# Patient Record
Sex: Female | Born: 1985 | Race: White | Hispanic: No | Marital: Married | Smoking: Never smoker
Health system: Southern US, Community
[De-identification: ages and names within clinical notes are randomized; demographics above are authoritative.]

## PROBLEM LIST (undated history)

## (undated) DIAGNOSIS — F329 Major depressive disorder, single episode, unspecified: Secondary | ICD-10-CM

## (undated) DIAGNOSIS — R319 Hematuria, unspecified: Secondary | ICD-10-CM

## (undated) DIAGNOSIS — T7840XA Allergy, unspecified, initial encounter: Secondary | ICD-10-CM

## (undated) DIAGNOSIS — F32A Depression, unspecified: Secondary | ICD-10-CM

## (undated) DIAGNOSIS — I7300 Raynaud's syndrome without gangrene: Secondary | ICD-10-CM

## (undated) DIAGNOSIS — R32 Unspecified urinary incontinence: Secondary | ICD-10-CM

## (undated) HISTORY — DX: Unspecified urinary incontinence: R32

## (undated) HISTORY — PX: WISDOM TOOTH EXTRACTION: SHX21

## (undated) HISTORY — DX: Depression, unspecified: F32.A

## (undated) HISTORY — DX: Hematuria, unspecified: R31.9

## (undated) HISTORY — DX: Allergy, unspecified, initial encounter: T78.40XA

## (undated) HISTORY — PX: ADENOIDECTOMY: SUR15

## (undated) HISTORY — DX: Major depressive disorder, single episode, unspecified: F32.9

## (undated) HISTORY — PX: TONSILLECTOMY: SUR1361

## (undated) HISTORY — PX: TYMPANOSTOMY: SHX2586

## (undated) HISTORY — DX: Raynaud's syndrome without gangrene: I73.00

---

## 2008-08-26 DIAGNOSIS — Z86018 Personal history of other benign neoplasm: Secondary | ICD-10-CM

## 2008-08-26 HISTORY — DX: Personal history of other benign neoplasm: Z86.018

## 2008-09-26 ENCOUNTER — Ambulatory Visit: Payer: Self-pay | Admitting: Internal Medicine

## 2011-01-08 ENCOUNTER — Ambulatory Visit: Payer: Self-pay | Admitting: Family Medicine

## 2012-12-13 IMAGING — CR DG CHEST 2V
1 series · 2 of 2 positions shown · non-contrast
Comparison: none

REASON FOR EXAM: fever cough
COMMENTS:

PROCEDURE:     KDR - KDXR CHEST PA (OR AP) AND LAT  - January 08, 2011 [DATE]
RESULT:     Comparison: None

[Series 1: view not recorded · 0.17mm/px · 2 of 2 slices shown]
[im 1/2]
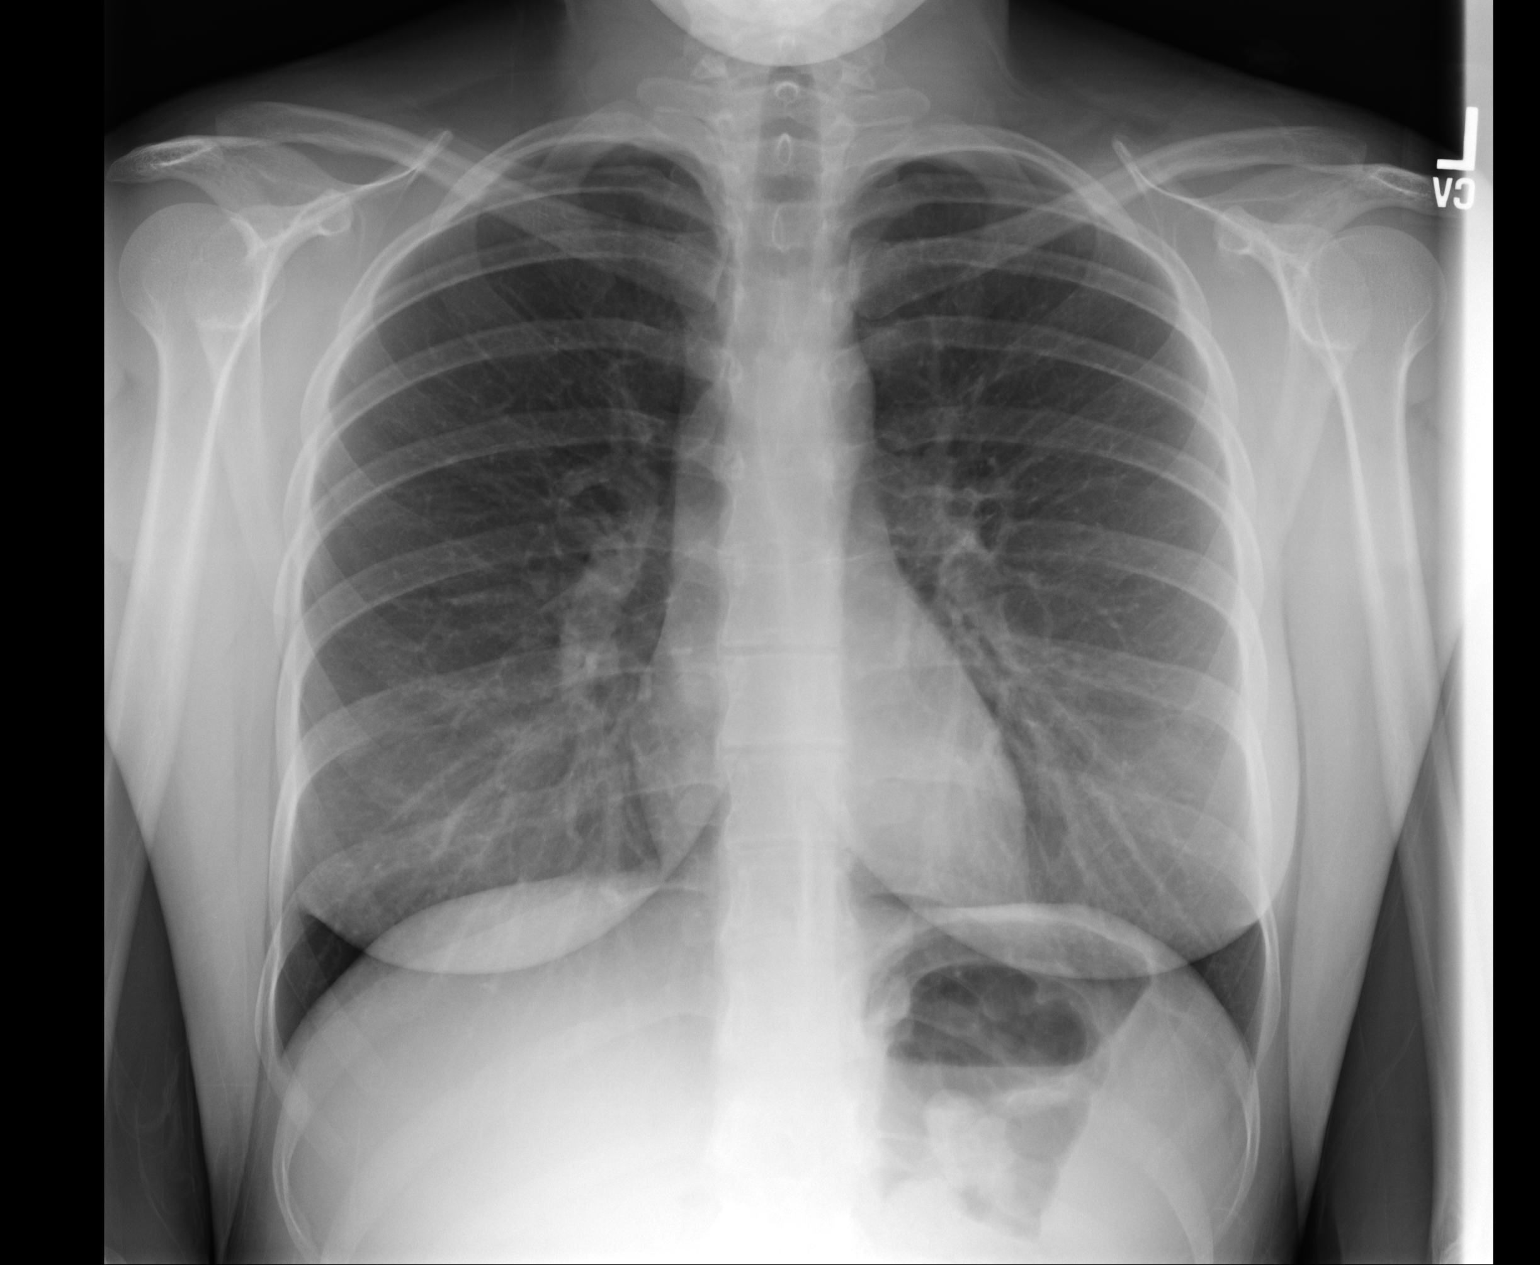
[im 2/2]
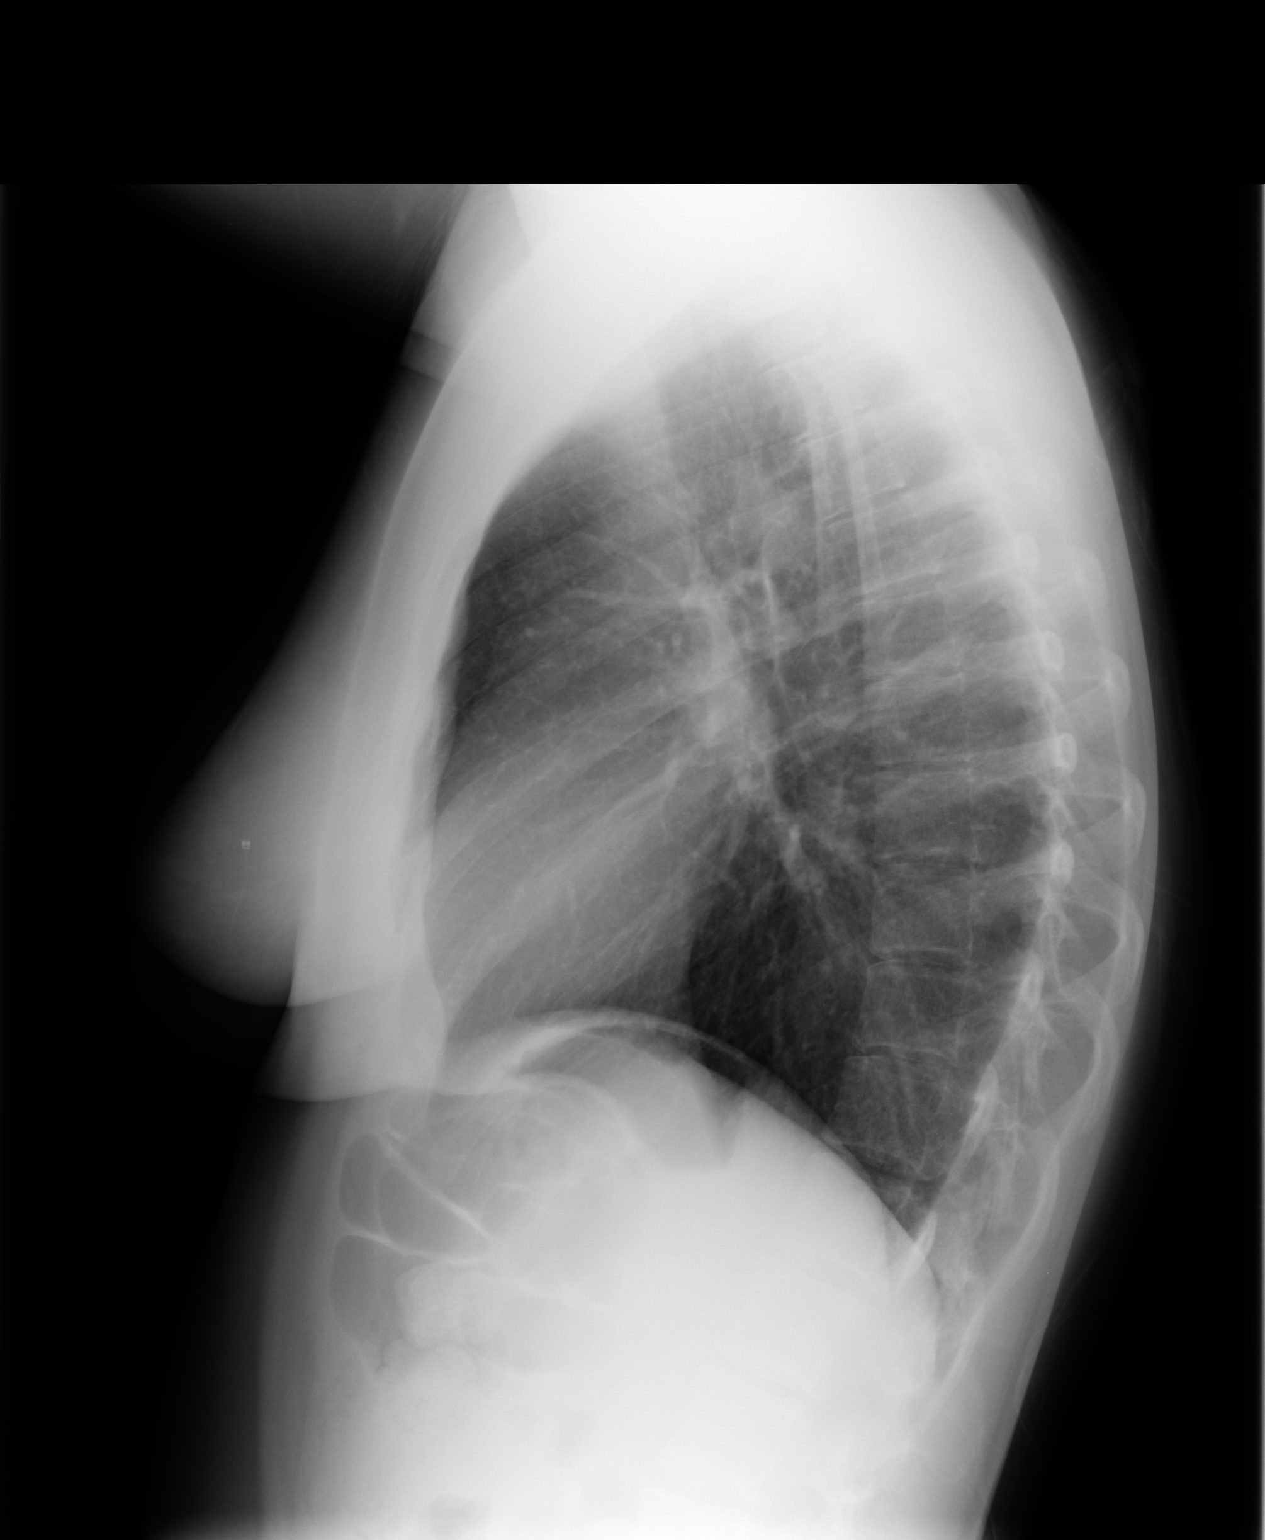

[2 of 2 positions shown; findings below may reference images not displayed]

FINDINGS: PA and lateral chest radiographs are provided.  There is no focal
parenchymal opacity, pleural effusion, or pneumothorax. The heart and
mediastinum are unremarkable.  The osseous structures are unremarkable.
IMPRESSION: No acute disease of the chest.

## 2015-03-31 DIAGNOSIS — K580 Irritable bowel syndrome with diarrhea: Secondary | ICD-10-CM | POA: Insufficient documentation

## 2015-03-31 DIAGNOSIS — M419 Scoliosis, unspecified: Secondary | ICD-10-CM | POA: Insufficient documentation

## 2015-03-31 LAB — HM HIV SCREENING LAB: HM HIV SCREENING: NEGATIVE

## 2015-12-05 DIAGNOSIS — F4329 Adjustment disorder with other symptoms: Secondary | ICD-10-CM | POA: Insufficient documentation

## 2015-12-17 DIAGNOSIS — N61 Mastitis without abscess: Secondary | ICD-10-CM | POA: Insufficient documentation

## 2015-12-22 DIAGNOSIS — N3946 Mixed incontinence: Secondary | ICD-10-CM | POA: Insufficient documentation

## 2016-12-05 LAB — HM PAP SMEAR: HM Pap smear: NORMAL

## 2017-09-20 ENCOUNTER — Ambulatory Visit (INDEPENDENT_AMBULATORY_CARE_PROVIDER_SITE_OTHER): Payer: BC Managed Care – PPO

## 2017-09-20 ENCOUNTER — Ambulatory Visit
Admission: EM | Admit: 2017-09-20 | Discharge: 2017-09-20 | Disposition: A | Discharge status: Home / Self Care | Payer: BC Managed Care – PPO | Attending: Family Medicine | Admitting: Family Medicine

## 2017-09-20 ENCOUNTER — Other Ambulatory Visit: Payer: Self-pay

## 2017-09-20 DIAGNOSIS — J181 Lobar pneumonia, unspecified organism: Secondary | ICD-10-CM

## 2017-09-20 DIAGNOSIS — R059 Cough, unspecified: Secondary | ICD-10-CM

## 2017-09-20 DIAGNOSIS — R05 Cough: Secondary | ICD-10-CM

## 2017-09-20 DIAGNOSIS — J189 Pneumonia, unspecified organism: Secondary | ICD-10-CM

## 2017-09-20 DIAGNOSIS — R509 Fever, unspecified: Secondary | ICD-10-CM

## 2017-09-20 MED ORDER — METHYLPREDNISOLONE SODIUM SUCC 40 MG IJ SOLR
80.0000 mg | Freq: Once | INTRAMUSCULAR | Status: AC
  Administered 2017-09-20: 80 mg via INTRAMUSCULAR

## 2017-09-20 MED ORDER — ALBUTEROL SULFATE HFA 108 (90 BASE) MCG/ACT IN AERS: 2.0000 | INHALATION_SPRAY | Freq: Four times a day (QID) | RESPIRATORY_TRACT | 0 refills | Status: DC | PRN

## 2017-09-20 MED ORDER — PREDNISONE 50 MG PO TABS: 50.0000 mg | ORAL_TABLET | Freq: Every day | ORAL | 0 refills | Status: DC

## 2017-09-20 MED ORDER — LEVOFLOXACIN 500 MG PO TABS: 500.0000 mg | ORAL_TABLET | Freq: Every day | ORAL | 0 refills | Status: AC

## 2017-09-20 MED ORDER — IBUPROFEN 600 MG PO TABS: 600.0000 mg | ORAL_TABLET | Freq: Three times a day (TID) | ORAL | 0 refills | Status: AC | PRN

## 2017-09-20 MED ORDER — IPRATROPIUM-ALBUTEROL 0.5-2.5 (3) MG/3ML IN SOLN
3.0000 mL | Freq: Once | RESPIRATORY_TRACT | Status: AC
  Administered 2017-09-20: 3 mL via RESPIRATORY_TRACT

## 2017-09-20 MED ORDER — ONDANSETRON 8 MG PO TBDP: 8.0000 mg | ORAL_TABLET | Freq: Three times a day (TID) | ORAL | 0 refills | Status: DC | PRN

## 2017-09-20 NOTE — ED Triage Notes (Signed)
Patient c/o cough, fever, fatigue. body aches, dizziness and vomiting x 8 days. Patient states she has not had an appetite but is able to keep fluids down.

## 2017-09-20 NOTE — Discharge Instructions (Addendum)
You were given a breathing treatment here today (Albuterol) as well as a shot of Prednisone (Solu-Medrol) to help with your breathing and symptoms. Recommend start Levaquin 500mg  once daily for 7 days. Start Prednisone 50mg  daily for 5 days then stop. May take Zofran 8mg  every 8 hours as needed for nausea. Increase fluid intake. May use Albuterol inhaler 2 puffs every 6 hours as needed for chest tightness/wheezing. Recommend take Ibuprofen 600mg  and alternate every 3 hours with Tylenol 1000mg  for fever. May take OTC cough medication to help with sleep as needed. [facility] in 3 days for recheck or go to ER if symptoms do not improve within 48 hours or ASAP if symptoms worsen.

## 2017-09-20 NOTE — ED Provider Notes (Signed)
[facility]    CSN: [id number] Arrival date & time: 09/20/17  1030     History   Chief Complaint Chief Complaint  Patient presents with  . Cough    HPI Barbara Perez is a 32 y.o. female.   32 year old female accompanied by her step mom presents with cough, fatigue, chills and fevers around 100-101 for the past 8 days. Also having decreased appetite and vomiting for the past 6 days. Able to keep liquids down and occasional soft foods. Having difficulty taking deep breaths and unable to sleep or lay down due to cough. 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 Husband had mild similar symptoms for about 3 days and then resolved. She has tried Garment/textile technologist Rub and Robitussin, with minimal relief. Co-worker called in Tessalon pills, as well as liquid cough medication with codeine as well as Zofran 4mg  for nausea with minimal relief. She has been also taking Tylenol and Ibuprofen with some relief in decreasing fevers. No history of asthma or other chronic health issues.    The history is provided by the patient and a caregiver.    History reviewed. No pertinent past medical history.  There are no active problems to display for this patient.   Past Surgical History:  Procedure Laterality Date  . ADENOIDECTOMY    . TONSILLECTOMY      OB History    No data available       Home Medications    Prior to Admission medications   Medication Sig Start Date End Date Taking? Authorizing Provider  acetaminophen (TYLENOL) 500 MG tablet Take 500 mg by mouth every 6 (six) hours as needed.   Yes [provider]  benzonatate (TESSALON) 100 MG capsule Take 100 mg by mouth 3 (three) times daily as needed for cough.   Yes [provider]  guaiFENesin-codeine (VIRTUSSIN A/C) 100-10 MG/5ML syrup Take 5 mLs by mouth 3 (three) times daily as needed for cough.   Yes [provider]  guaiFENesin-dextromethorphan (ROBITUSSIN DM) 100-10 MG/5ML syrup  Take 5 mLs by mouth every 4 (four) hours as needed for cough.   Yes [provider]  levonorgestrel (MIRENA) 20 MCG/24HR IUD 1 each by Intrauterine route once.   Yes [provider]  loratadine (CLARITIN) 10 MG tablet Take 10 mg by mouth daily.   Yes [provider]  albuterol (PROVENTIL HFA;VENTOLIN HFA) 108 (90 Base) MCG/ACT inhaler Inhale 2 puffs into the lungs every 6 (six) hours as needed for wheezing or shortness of breath. 09/20/17   Khamiyah Grefe, Ali Lowe, NP  ibuprofen (ADVIL,MOTRIN) 600 MG tablet Take 1 tablet (600 mg total) by mouth every 8 (eight) hours as needed for fever or moderate pain. 09/20/17   Sudie Grumbling, NP  levofloxacin (LEVAQUIN) 500 MG tablet Take 1 tablet (500 mg total) by mouth daily for 7 days. 09/20/17 09/27/17  Sudie Grumbling, NP  ondansetron (ZOFRAN ODT) 8 MG disintegrating tablet Take 1 tablet (8 mg total) by mouth every 8 (eight) hours as needed for nausea or vomiting. 09/20/17   Roseline Ebarb, Ali Lowe, NP  predniSONE (DELTASONE) 50 MG tablet Take 1 tablet (50 mg total) by mouth daily with supper. 09/20/17   Sudie Grumbling, NP    Family History Family History  Problem Relation Age of Onset  . Brain cancer Mother   . Cerebral aneurysm Mother     Social History Social History   Tobacco Use  .  Smoking status: Never Smoker  . Smokeless tobacco: Never Used  Substance Use Topics  . Alcohol use: No    Frequency: Never  . Drug use: No     Allergies   Ceclor [cefaclor] and Penicillins   Review of Systems Review of Systems  Constitutional: Positive for activity change, appetite change, chills, fatigue and fever.  HENT: Positive for congestion, postnasal drip, sinus pressure and sore throat. Negative for ear discharge, ear pain, facial swelling, mouth sores, rhinorrhea, sinus pain and trouble swallowing.   Eyes: Negative for pain, discharge, redness and itching.  Respiratory: Positive for cough, chest tightness, shortness of breath and  wheezing. Negative for stridor.   Cardiovascular: Negative for chest pain.  Gastrointestinal: Positive for nausea and vomiting. Negative for abdominal pain and diarrhea.  Genitourinary: Positive for enuresis (due to stress incontinence when coughing) and urgency. Negative for decreased urine volume and difficulty urinating.  Musculoskeletal: Positive for arthralgias and myalgias. Negative for neck pain and neck stiffness.  Skin: Negative for rash and wound.  Neurological: Positive for light-headedness and headaches. Negative for dizziness, tremors, seizures, syncope, weakness and numbness.  Hematological: Negative for adenopathy. Does not bruise/bleed easily.  Psychiatric/Behavioral: Negative.      Physical Exam Triage Vital Signs ED Triage Vitals  Enc Vitals Group     BP 09/20/17 1110 (!) 105/58     Pulse Rate 09/20/17 1110 98     Resp --      Temp 09/20/17 1110 98.1 F (36.7 C)     Temp Source 09/20/17 1110 Axillary     SpO2 09/20/17 1110 97 %     Weight 09/20/17 1112 144 lb (65.3 kg)     Height 09/20/17 1112 5\' 2"  (1.575 m)     Head Circumference --      Peak Flow --      Pain Score 09/20/17 1110 2     Pain Loc --      Pain Edu? --      Excl. in GC? --    No data found.  Updated Vital Signs BP (!) 105/58 (BP Location: Left Arm)   Pulse 98   Temp 98.1 F (36.7 C) (Axillary)   Ht 5\' 2"  (1.575 m)   Wt 144 lb (65.3 kg)   SpO2 97%   BMI 26.34 kg/m   Visual Acuity Right Eye Distance:   Left Eye Distance:   Bilateral Distance:    Right Eye Near:   Left Eye Near:    Bilateral Near:     Physical Exam  Constitutional: She is oriented to person, place, and time. She appears well-developed and well-nourished. She has a sickly appearance. She appears ill. No distress.  Patient sitting on exam table and appears ill. In no acute distress.   HENT:  Head: Normocephalic and atraumatic.  Right Ear: Hearing, tympanic membrane, external ear and ear canal normal.  Left Ear:  Hearing, tympanic membrane, external ear and ear canal normal.  Nose: Rhinorrhea present. No mucosal edema. Right sinus exhibits no maxillary sinus tenderness and no frontal sinus tenderness. Left sinus exhibits no maxillary sinus tenderness and no frontal sinus tenderness.  Mouth/Throat: Uvula is midline and mucous membranes are normal. Posterior oropharyngeal erythema present.  Eyes: Conjunctivae and EOM are normal.  Neck: Normal range of motion. Neck supple.  Cardiovascular: Regular rhythm and normal heart sounds. Tachycardia present.  Pulmonary/Chest: Effort normal. No stridor. Tachypnea noted. No respiratory distress. She has decreased breath sounds in the right upper field, the right  lower field, the left upper field and the left lower field. She has no wheezes. She has no rhonchi. She has no rales.  Abdominal: Soft. Normal appearance and bowel sounds are normal. She exhibits no mass. There is generalized tenderness. There is no rigidity, no rebound, no guarding and no CVA tenderness.  Lymphadenopathy:    She has no cervical adenopathy.  Neurological: She is alert and oriented to person, place, and time.  Skin: Skin is warm. Capillary refill takes less than 2 seconds. She is diaphoretic.  Psychiatric: She has a normal mood and affect. Her behavior is normal. Judgment and thought content normal.     UC Treatments / Results  Labs (all labs ordered are listed, but only abnormal results are displayed) Labs Reviewed - No data to display  EKG  EKG Interpretation None       Radiology Dg Chest 2 View  Result Date: 09/20/2017 CLINICAL DATA:  32 year old female with a history of cough and shortness of breath EXAM: CHEST  2 VIEW COMPARISON:  01/08/2011 FINDINGS: Cardiomediastinal silhouette unchanged in size and contour. Mixed nodular and reticular opacities at the bilateral lung bases, worst on the right. No pleural effusion. No pneumothorax. No acute fracture. IMPRESSION: Multifocal  pneumonia, worst on the right. Electronically Signed   By: Gilmer Mor D.O.   On: 09/20/2017 13:01    Procedures Procedures (including critical care time)  Medications Ordered in UC Medications  ipratropium-albuterol (DUONEB) 0.5-2.5 (3) MG/3ML nebulizer solution 3 mL (3 mLs Nebulization Given 09/20/17 1155)  methylPREDNISolone sodium succinate (SOLU-MEDROL) 40 mg/mL injection 80 mg (80 mg Intramuscular Given 09/20/17 1344)     Initial Impression / Assessment and Plan / UC Course  I have reviewed the triage vital signs and the nursing notes.  Pertinent labs & imaging results that were available during my care of the patient were reviewed by me and considered in my medical decision making (see chart for details).    Reviewed clinical findings- gave DuoNeb treatment which did help with chest tightness and decreased rate of breathing. Still decreased breath sounds but increased air movement.  Reviewed chest x-ray results with patient- reviewed that pneumonia could still be viral but will treat for possible bacterial pneumonia. Due to chest inflammation, gave SoluMedrol 80mg  IM now.  Recommend start Levaquin 500mg  once daily. Start Prednisone 50mg  daily for 5 days then stop. May take Zofran 8mg  every 8 hours as needed for nausea. Increase fluid intake. May use Albuterol inhaler 2 puffs every 6 hours as needed for chest tightness/wheezing. Recommend take Ibuprofen 600mg  and alternate every 3 hours with Tylenol 1000mg  for fever. May take OTC cough medication to help with cough at night as needed. Note written for work. Follow-up here at [facility] in 3 days for recheck or go to ER if symptoms do not improve within 48 hours or ASAP if symptoms worsen.    Final Clinical Impressions(s) / UC Diagnoses   Final diagnoses:  Community acquired pneumonia of right lower lobe of lung (HCC)  Community acquired pneumonia of left lower lobe of lung (HCC)  Fever, unspecified  Cough    ED  Discharge Orders        Ordered    levofloxacin (LEVAQUIN) 500 MG tablet  Daily     09/20/17 1343    albuterol (PROVENTIL HFA;VENTOLIN HFA) 108 (90 Base) MCG/ACT inhaler  Every 6 hours PRN     09/20/17 1343    ondansetron (ZOFRAN ODT) 8 MG disintegrating tablet  Every  8 hours PRN     09/20/17 1343    ibuprofen (ADVIL,MOTRIN) 600 MG tablet  Every 8 hours PRN     09/20/17 1343    predniSONE (DELTASONE) 50 MG tablet  Daily with supper     09/20/17 1344       Controlled Substance Prescriptions  Controlled Substance Registry consulted? No   Sudie Grumbling, NP 09/20/17 2159

## 2017-09-23 ENCOUNTER — Encounter: Payer: Self-pay | Admitting: Family Medicine

## 2017-09-23 ENCOUNTER — Ambulatory Visit: Payer: BC Managed Care – PPO | Admitting: Family Medicine

## 2017-09-23 VITALS — BP 100/64 | HR 70 | Temp 97.6°F | Resp 16 | Ht 62.0 in | Wt 144.0 lb

## 2017-09-23 DIAGNOSIS — J189 Pneumonia, unspecified organism: Secondary | ICD-10-CM | POA: Diagnosis not present

## 2017-09-23 DIAGNOSIS — I7300 Raynaud's syndrome without gangrene: Secondary | ICD-10-CM | POA: Insufficient documentation

## 2017-09-23 DIAGNOSIS — N393 Stress incontinence (female) (male): Secondary | ICD-10-CM

## 2017-09-23 DIAGNOSIS — R32 Unspecified urinary incontinence: Secondary | ICD-10-CM | POA: Insufficient documentation

## 2017-09-23 NOTE — Progress Notes (Signed)
Patient: Barbara Perez, Female    DOB: [birth year], 32 y.o.   MRN: [id number] Visit Date: 09/23/2017  Today's Provider: Shirlee Latch, MD   Chief Complaint  Patient presents with  . Establish Care   Subjective:    Establish care Barbara Perez is a 32 y.o. female who presents today for health maintenance and complete physical. She feels fairly well. She was recently treated for CAP, and is c/o stress incontinence due to cough.  She reports exercising once per week for 60 minutes Dances or swims. She reports she is sleeping fairly well.  Stress urinary incontinence:  Recently gave birth to a 9 lb in March of 2017.  Since that time, she has noticed leakage of urine with coughing and sneezing.  It does not seem to be improving with time.  She is talked to her midwives who delivered her baby about this at her postpartum follow-up are recommended Kegel exercises.  This does not seem to have helped.  She has never done formal physical therapy for this.  -----------------------------------------------------------------  Follow up Urgent Care visit  Patient was seen at [facility] for cough on 09/20/2017. She was treated for CAP bilateral lower lungs. Treatment for this included 7 d course of levaquin and prednisone 5 d She reports good compliance with treatment. She reports this condition is Improved. Pt reports she feels 40% improved.  Complication from the flu Day 8 of flu worsened shortness of breath found to have PNA  ------------------------------------------------------------------------------------    Review of Systems  Constitutional: Positive for fatigue.  HENT: Positive for sinus pressure.   Respiratory: Positive for cough and shortness of breath.   Gastrointestinal: Positive for constipation.  Endocrine: Positive for cold intolerance.  Genitourinary: Positive for urgency.  Allergic/Immunologic: Positive for environmental allergies.  Neurological: Positive for  headaches.  All other systems reviewed and are negative.   Social History      She  reports that  has never smoked. she has never used smokeless tobacco. She reports that she drinks alcohol. She reports that she does not use drugs.       Social History   Socioeconomic History  . Marital status: Married    Spouse name: Orthoptist  . Number of children: 1  . Years of education: 60  . Highest education level: Bachelor's degree (e.g., BA, AB, BS)  Social Needs  . Financial resource strain: Not very hard  . Food insecurity - worry: Never true  . Food insecurity - inability: Never true  . Transportation needs - medical: No  . Transportation needs - non-medical: No  Occupational History    Employer: [employer]  Tobacco Use  . Smoking status: Never Smoker  . Smokeless tobacco: Never Used  Substance and Sexual Activity  . Alcohol use: Yes    Frequency: Never    Comment: rare  . Drug use: No  . Sexual activity: Yes  Other Topics Concern  . Not on file  Social History Narrative  . Not on file    Past Medical History:  Diagnosis Date  . Allergy   . Depression    postpartum  . Hematuria   . Reynolds syndrome [facility])      There are no active problems to display for this patient.   Past Surgical History:  Procedure Laterality Date  . ADENOIDECTOMY    . TONSILLECTOMY    . TYMPANOSTOMY      Family History  Family Status  Relation Name Status  . Mother  Deceased  . Father  Alive  . Brother Half Brother Alive  . MGM  Alive  . MGF  Alive  . PGM  Alive  . PGF  Alive  . Brother Half Brother Alive        Her family history includes Alcohol abuse in her father; Anemia in her brother; Anxiety disorder in her brother; Brain cancer in her mother; Cataracts in her maternal grandfather; Cerebral aneurysm in her mother; Depression in her brother; Diabetes in her maternal grandmother; Heart attack in her paternal grandfather; Hypertension in her maternal  grandfather; Hypothyroidism in her maternal grandmother; Lung cancer in her paternal grandmother; Skin cancer in her maternal grandfather.     Allergies  Allergen Reactions  . Measles, Mumps & Rubella Vac Anaphylaxis  . Ceclor [Cefaclor] Other (See Comments)  . Penicillins Other (See Comments)  . Sulfamethoxazole-Trimethoprim     Other reaction(s): Other (See Comments) Unknown, rxn as a child     Current Outpatient Medications:  .  acetaminophen (TYLENOL) 500 MG tablet, Take 500 mg by mouth every 6 (six) hours as needed., Disp: , Rfl:  .  albuterol (PROVENTIL HFA;VENTOLIN HFA) 108 (90 Base) MCG/ACT inhaler, Inhale 2 puffs into the lungs every 6 (six) hours as needed for wheezing or shortness of breath., Disp: 1 Inhaler, Rfl: 0 .  benzonatate (TESSALON) 100 MG capsule, Take 100 mg by mouth 3 (three) times daily as needed for cough., Disp: , Rfl:  .  guaiFENesin-dextromethorphan (ROBITUSSIN DM) 100-10 MG/5ML syrup, Take 5 mLs by mouth every 4 (four) hours as needed for cough., Disp: , Rfl:  .  ibuprofen (ADVIL,MOTRIN) 600 MG tablet, Take 1 tablet (600 mg total) by mouth every 8 (eight) hours as needed for fever or moderate pain., Disp: 30 tablet, Rfl: 0 .  levofloxacin (LEVAQUIN) 500 MG tablet, Take 1 tablet (500 mg total) by mouth daily for 7 days., Disp: 7 tablet, Rfl: 0 .  levonorgestrel (MIRENA) 20 MCG/24HR IUD, 1 each by Intrauterine route once., Disp: , Rfl:  .  loratadine (CLARITIN) 10 MG tablet, Take 10 mg by mouth daily., Disp: , Rfl:  .  Multiple Vitamin (MULTIVITAMIN) tablet, Take 1 tablet by mouth daily., Disp: , Rfl:  .  ondansetron (ZOFRAN ODT) 8 MG disintegrating tablet, Take 1 tablet (8 mg total) by mouth every 8 (eight) hours as needed for nausea or vomiting., Disp: 20 tablet, Rfl: 0 .  predniSONE (DELTASONE) 50 MG tablet, Take 1 tablet (50 mg total) by mouth daily with supper., Disp: 5 tablet, Rfl: 0   Patient Care Team: Erasmo Downer, MD as PCP - General (Family  Medicine)      Objective:   Vitals: BP 100/64 (BP Location: Left Arm, Patient Position: Sitting, Cuff Size: Normal)   Pulse 70   Temp 98 F (36.7 C)   Resp 16   Ht 5\' 2"  (1.575 m)   Wt 144 lb (65.3 kg)   LMP 09/21/2017   SpO2 98%   BMI 26.34 kg/m    Vitals:   09/23/17 1019  BP: 100/64  Pulse: 70  Resp: 16  Temp: 98 F (36.7 C)  SpO2: 98%  Weight: 144 lb (65.3 kg)  Height: 5\' 2"  (1.575 m)     Physical Exam  Constitutional: She is oriented to person, place, and time. She appears well-developed and well-nourished. No distress.  HENT:  Head: Normocephalic and atraumatic.  Right Ear: External ear normal.  Left  Ear: External ear normal.  Nose: Nose normal.  Mouth/Throat: Oropharynx is clear and moist.  Eyes: Conjunctivae and EOM are normal. Pupils are equal, round, and reactive to light. No scleral icterus.  Neck: Neck supple. No thyromegaly present.  Cardiovascular: Normal rate, regular rhythm, normal heart sounds and intact distal pulses.  No murmur heard. Pulmonary/Chest: Effort normal. No respiratory distress. She has rales.  Rhonchi and crackles in bilateral lung bases, right greater than left  Abdominal: Soft. Bowel sounds are normal. She exhibits no distension. There is no tenderness.  Musculoskeletal: She exhibits no edema or deformity.  Lymphadenopathy:    She has no cervical adenopathy.  Neurological: She is alert and oriented to person, place, and time.  Skin: Skin is warm and dry. No rash noted.  Psychiatric: She has a normal mood and affect. Her behavior is normal.  Vitals reviewed.    Depression Screen PHQ 2/9 Scores 09/23/2017  PHQ - 2 Score 0  PHQ- 9 Score 3     Assessment & Plan:    Problem List Items Addressed This Visit      Cardiovascular and Mediastinum   Raynaud's disease    Currently well controlled without medications Needed nifedipine with breast-feeding due to vaso-constriction        Respiratory   Community acquired  pneumonia - Primary    Slowly improving Flu complication despite getting flu shot this year She is about halfway through her course of Levaquin Discussed natural course of slow improvement in fatigue and cough Return precautions discussed including worsening of fever or other symptoms        Other   Urinary incontinence    Stress urinary incontinence after vaginal delivery that has not improved over the last nearly 2 years She is tried home exercises without relief Referral to pelvic floor physical therapy for formal therapy      Relevant Orders   Ambulatory referral to Physical Therapy      Records from previous gynecologist reviewed in care everywhere  Return in about 5 months (around 02/21/2018).  For annual physical   The entirety of the information documented in the History of Present Illness, Review of Systems and Physical Exam were personally obtained by me. Portions of this information were initially documented by Irving BurtonEmily Ratchford, CMA and reviewed by me for thoroughness and accuracy.    Erasmo DownerBacigalupo, Angela M, MD, MPH [facility] 09/24/2017 11:46 AM

## 2017-09-23 NOTE — Patient Instructions (Signed)
Urinary Incontinence Urinary incontinence is the involuntary loss of urine from your bladder. What are the causes? There are many causes of urinary incontinence. They include:  Medicines.  Infections.  Prostatic enlargement, leading to overflow of urine from your bladder.  Surgery.  Neurological diseases.  Emotional factors.  What are the signs or symptoms? Urinary Incontinence can be divided into four types: 1. Urge incontinence. Urge incontinence is the involuntary loss of urine before you have the opportunity to go to the bathroom. There is a sudden urge to void but not enough time to reach a bathroom. 2. Stress incontinence. Stress incontinence is the sudden loss of urine with any activity that forces urine to pass. It is commonly caused by anatomical changes to the pelvis and sphincter areas of your body. 3. Overflow incontinence. Overflow incontinence is the loss of urine from an obstructed opening to your bladder. This results in a backup of urine and a resultant buildup of pressure within the bladder. When the pressure within the bladder exceeds the closing pressure of the sphincter, the urine overflows, which causes incontinence, similar to water overflowing a dam. 4. Total incontinence. Total incontinence is the loss of urine as a result of the inability to store urine within your bladder.  How is this diagnosed? Evaluating the cause of incontinence may require:  A thorough and complete medical and obstetric history.  A complete physical exam.  Laboratory tests such as a urine culture and sensitivities.  When additional tests are indicated, they can include:  An ultrasound exam.  Kidney and bladder X-rays.  Cystoscopy. This is an exam of the bladder using a narrow scope.  Urodynamic testing to test the nerve function to the bladder and sphincter areas.  How is this treated? Treatment for urinary incontinence depends on the cause:  For urge incontinence caused  by a bacterial infection, antibiotics will be prescribed. If the urge incontinence is related to medicines you take, your health care provider may have you change the medicine.  For stress incontinence, surgery to re-establish anatomical support to the bladder or sphincter, or both, will often correct the condition.  For overflow incontinence caused by an enlarged prostate, an operation to open the channel through the enlarged prostate will allow the flow of urine out of the bladder. In women with fibroids, a hysterectomy may be recommended.  For total incontinence, surgery on your urinary sphincter may help. An artificial urinary sphincter (an inflatable cuff placed around the urethra) may be required. In women who have developed a hole-like passage between their bladder and vagina (vesicovaginal fistula), surgery to close the fistula often is required.  Follow these instructions at home:  Normal daily hygiene and the use of pads or adult diapers that are changed regularly will help prevent odors and skin damage.  Avoid caffeine. It can overstimulate your bladder.  Use the bathroom regularly. Try about every 2-3 hours to go to the bathroom, even if you do not feel the need to do so. Take time to empty your bladder completely. After urinating, wait a minute. Then try to urinate again.  For causes involving nerve dysfunction, keep a log of the medicines you take and a journal of the times you go to the bathroom. Contact a health care provider if:  You experience worsening of pain instead of improvement in pain after your procedure.  Your incontinence becomes worse instead of better. Get help right away if:  You experience fever or shaking chills.  You are unable to   pass your urine.  You have redness spreading into your groin or down into your thighs. This information is not intended to replace advice given to you by your health care provider. Make sure you discuss any questions you have  with your health care provider. Document Released: 09/19/2004 Document Revised: 03/22/2016 Document Reviewed: 01/19/2013 Elsevier Interactive Patient Education  2018 Elsevier Inc.  

## 2017-09-24 NOTE — Assessment & Plan Note (Signed)
Slowly improving Flu complication despite getting flu shot this year She is about halfway through her course of Levaquin Discussed natural course of slow improvement in fatigue and cough Return precautions discussed including worsening of fever or other symptoms

## 2017-09-24 NOTE — Assessment & Plan Note (Signed)
Currently well controlled without medications Needed nifedipine with breast-feeding due to vaso-constriction

## 2017-09-24 NOTE — Assessment & Plan Note (Signed)
Stress urinary incontinence after vaginal delivery that has not improved over the last nearly 2 years She is tried home exercises without relief Referral to pelvic floor physical therapy for formal therapy

## 2017-10-06 ENCOUNTER — Ambulatory Visit: Payer: BC Managed Care – PPO | Attending: Family Medicine

## 2017-10-06 ENCOUNTER — Other Ambulatory Visit: Payer: Self-pay

## 2017-10-06 DIAGNOSIS — K5900 Constipation, unspecified: Secondary | ICD-10-CM | POA: Diagnosis present

## 2017-10-06 DIAGNOSIS — M6281 Muscle weakness (generalized): Secondary | ICD-10-CM | POA: Diagnosis present

## 2017-10-06 DIAGNOSIS — R278 Other lack of coordination: Secondary | ICD-10-CM | POA: Diagnosis present

## 2017-10-06 DIAGNOSIS — N393 Stress incontinence (female) (male): Secondary | ICD-10-CM | POA: Diagnosis present

## 2017-10-06 DIAGNOSIS — M791 Myalgia, unspecified site: Secondary | ICD-10-CM | POA: Diagnosis present

## 2017-10-06 NOTE — Patient Instructions (Addendum)
Kegel exercises:    With neutral spine, tighten pelvic floor by imagining you are stopping the flow of urine, squeezing only around the vagina and anus.  Quick-Flicks: Pull up and in quickly and then relax allowing just enough time for the muscles to full lengthen before the next contraction. Do _8__ repetitions in a row, stopping if the pelvic floor muscle gets tired and other muscles try to take over.  Long-Holds: Hold for _5__ seconds and then fully release, repeat _5__ times.   Repeat both of these exercises _1-5__ times throughout the day       Check in with your posture regularly throughout the day and try to "turn on" your lower belly muscles.    Keep spine tall, lean forward and take 5 deep breaths, relax, repeat 2-3 times.

## 2017-10-06 NOTE — Therapy (Deleted)
[place] [facility] MAIN [facility] SERVICES [address] [place], [place], [address] Phone: [phone]   Fax:  [phone]  Physical Therapy Treatment  Patient Details  Name: Barbara Perez MRN: [id number] Date of Birth: Oct 25, 1985 No Data Recorded  Encounter Date: 10/13/2017    Past Medical History:  Diagnosis Date  . Allergy   . Depression    postpartum  . 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 Raynaud's disease   . Urinary incontinence     Past Surgical History:  Procedure Laterality Date  . ADENOIDECTOMY    . TONSILLECTOMY    . TYMPANOSTOMY    . WISDOM TOOTH EXTRACTION      There were no vitals filed for this visit.   Pelvic Floor Physical Therapy Evaluation and Assessment  SCREENING  Patient's communication preference:   Red Flags:  Have you had any night sweats? No Fever, pnemonia. Unexplained weight loss? Not unexplained Saddle anesthesia? no Unexplained changes in bowel or bladder habits? no  SUBJECTIVE  Patient reports: Has had leakage with stress for ~ 2 years. Has has mild scoliosis since teenage. Low back/SI pain.  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  Child 9 lbs. 7.5 oz.  Gynecological History: none  Urinary History: Using panty liner most of the time.  Gastrointestinal History: IBS, unofficially diagnosed. Has a BM once every 3 days.  Sexual activity/pain: Sometimes painful especially during breastfeeding. Mostly anterior under pelvic bone.  Location of pain: low back Current pain:  0/10  Max pain:  2/10 (can get up to 6/10) Least pain:  0/10 Nature of pain: dull achy  Patient Goals: Decrease urinary incontinence, learn ways to decrease LBP  and IBS episodes.   OBJECTIVE  Posture/Observations:  Sitting: shifting between multiple sitting positions.  Standing:   Palpation/Segmental Motion/Joint Play:  Special tests:   Positive stork for instability B,   Range of Motion/Flexibilty:  Spine: mild L lumbo-thoracic curve.  Hips:    Strength/MMT:  LE MMT  LE MMT Left Right  Hip flex:  (L2) /5 /5  Hip ext: /5 /5  Hip abd: /5 /5  Hip add: /5 /5  Hip IR /5 /5  Hip ER /5 /5     Abdominal:  Palpation: Diastasis:  Pelvic Floor External Exam: Introitus Appears:  Skin integrity:  Palpation: Cough: Prolapse visible?: Scar mobility:  Internal Vaginal Exam: Strength (PERF):  Symmetry: Palpation: Prolapse:   Internal Rectal Exam: Strength (PERF): Symmetry: Palpation: Prolapse:   Gait Analysis:   Pelvic Floor Outcome Measures: ***  G codes?: ***       [facility] PT Assessment - 10/06/17 0001      Balance Screen   Has the patient fallen in the past 6 months  No    Has the patient had a decrease in activity level because of a fear of falling?   No    Is the patient reluctant to leave their home because of a fear of falling?   No                                        Patient will benefit from skilled therapeutic intervention in order to  improve the following deficits and impairments:     Visit Diagnosis: No diagnosis found.     Problem List Patient Active Problem List   Diagnosis Date Noted  . Community acquired pneumonia 09/23/2017  . Raynaud's disease   . Urinary incontinence     Cleophus MoltKeeli T Gailes 10/06/2017, 4:24 PM  [facility] [address] [place], [place], [address] Phone: [phone]   Fax:  [phone]  Name: Barbara Perez MRN: [id number] Date of Birth: 04/23/1986

## 2017-10-07 NOTE — Patient Instructions (Signed)
   Breathe in, let belly relax down toward the floor and then breathe out, pulling the lower belly in toward the backbone.   Repeat this _20__ times _1-5__ times per day

## 2017-10-08 NOTE — Therapy (Signed)
[facility] MAIN [facility] SERVICES [address] [place], [place], [address] Phone: [phone]   Fax:  [phone]  Physical Therapy Evaluation  Patient Details  Name: Barbara Perez MRN: [id number] Date of Birth: [birth year] Referring Provider: Erasmo Downer   Encounter Date: 10/06/2017  PT End of Session - 10/07/17 1328    Visit Number  1    Number of Visits  8    Date for PT Re-Evaluation  12/02/17    Authorization Type  BCBS    PT Start Time  1615    PT Stop Time  1705    PT Time Calculation (min)  50 min    Activity Tolerance  Patient tolerated treatment well    Behavior During Therapy  [facility] for tasks assessed/performed       Past Medical History:  Diagnosis Date  . Allergy   . Depression    postpartum  . 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 Raynaud's disease   . Urinary incontinence     Past Surgical History:  Procedure Laterality Date  . ADENOIDECTOMY    . TONSILLECTOMY    . TYMPANOSTOMY    . WISDOM TOOTH EXTRACTION      There were no vitals filed for this visit.    Pelvic Floor Physical Therapy Evaluation and Assessment  SCREENING  Red Flags:  Have you had any night sweats? Yes, fever from illness Unexplained weight loss? no Saddle anesthesia? no Unexplained changes in bowel or bladder habits? no  SUBJECTIVE  Patient reports: She started having issues right around the time she delivered her son ~ 2 years ago. She had a separated pubic symphysis following her delivery which caused her to have difficulty walking, she had to drag her right leg for months following (from March to November) and was not able to get into PT through [facility] though the referral had been in for a long time. She has urinary leakage with coughing, sneezing, and vomiting.   Social/Family/Vocational History:   Working full time as a Charity fundraiser at [employer]. Just stopped breastfeeding.  Recent  Procedures/Tests/Findings:  none  Obstetrical History: One delivery March 2017, no tearing or episiotomy.  Gynecological History: none  Urinary History: No issues prior to delivery. Leakage with stress  Gastrointestinal History: Constipation is a moderate problem.  Sexual activity/pain: Pain with intercourse especially while she was breastfeeding.  Location of pain: LBP Current pain:  0/10  Max pain:  7/10 Least pain:  0/10 Nature of pain: achy  Patient Goals: Decrease SUI, LBP, and constipation.   OBJECTIVE  Posture/Observations:  Sitting: anterior pelvic tilt Standing: anterior pelvic tilt, mild L scoliosis. L facing pelvis, R PSIS deep (mild).  Palpation/Segmental Motion/Joint Play:  Special tests:   Stork: positive for instability B, L>R  Range of Motion/Flexibilty:  Spine: mild L lumbo-thoracic scoliotic curve. Hips: -5 degrees hip extension B   Strength/MMT: L weaker than R, exact numbers lost with computer glitch, estimated below.  LE MMT  LE MMT Left Right  Hip flex:  (L2) /5 /5  Hip ext: 4+/5 4+/5  Hip abd: 4/5 5/5  Hip add: 4/5 4+/5  Hip IR 4+/5 5/5  Hip ER 4+/5 5/5     Abdominal:  Palpation: TTP through Psoas B, L>R, TTP through L QL, not TA recruitment in supine with cueing. Diastasis: none  Pelvic Floor External Exam: Deferred to next visit Introitus Appears:  Skin integrity:  Palpation: Cough: Prolapse visible?: Scar mobility:  Internal Vaginal Exam: Deferred to next visit Strength (PERF):  Symmetry: Palpation: Prolapse:   Gait Analysis: Deferred to next visit   Pelvic Floor Outcome Measures: PFDI: , PFIQ:  Interventions this session: Self-care: Educated on the structure and function of the pelvic floor in relation to their symptoms as well as the POC, and initial HEP in order to set patient expectations and understanding from which we will build on in the future sessions.  NM re-ed: Educated on and practiced  diaphragmatic breathing and TA recruitment in supine and quadruped to improve pelvic brace coordination, relaxation, and prepare for strengthening. Educated on how the TA and PFM work as a unit and how coordination training will help her decrease SUI.  Total time: 50 min.               Objective measurements completed on examination: See above findings.              PT Education - 10/07/17 1328    Education provided  Yes    Education Details  See Pt. instructions and Interventions this session    Person(s) Educated  Patient    Methods  Explanation;Demonstration;Tactile cues;Verbal cues    Comprehension  Verbalized understanding;Returned demonstration;Verbal cues required                  Plan - 10/08/17 1417    Clinical Impression Statement  Patient is a 32 y/o female who presents with cheif c/o stress urinary incontinence       Patient will benefit from skilled therapeutic intervention in order to improve the following deficits and impairments:     Visit Diagnosis: No diagnosis found.     Problem List Patient Active Problem List   Diagnosis Date Noted  . Community acquired pneumonia 09/23/2017  . Raynaud's disease   . Urinary incontinence    Cleophus MoltKeeli T. Camaryn Lumbert DPT, ATC Cleophus MoltKeeli T Izea Livolsi 10/08/2017, 2:30 PM  [place] [facility] [address] [place], [place], [address] Phone: [phone]   Fax:  [phone]  Name: Barbara Perez MRN: [id number] Date of Birth: 02/21/1986

## 2017-10-13 ENCOUNTER — Ambulatory Visit: Payer: BC Managed Care – PPO

## 2017-10-13 DIAGNOSIS — N393 Stress incontinence (female) (male): Secondary | ICD-10-CM

## 2017-10-13 DIAGNOSIS — M791 Myalgia, unspecified site: Secondary | ICD-10-CM | POA: Diagnosis not present

## 2017-10-13 DIAGNOSIS — M6281 Muscle weakness (generalized): Secondary | ICD-10-CM

## 2017-10-13 DIAGNOSIS — R278 Other lack of coordination: Secondary | ICD-10-CM

## 2017-10-13 DIAGNOSIS — K5900 Constipation, unspecified: Secondary | ICD-10-CM

## 2017-10-13 NOTE — Therapy (Signed)
[facility] MAIN [facility] SERVICES [address] [place], [place], [address] Phone: [phone]   Fax:  [phone]  Physical Therapy Treatment  Patient Details  Name: Barbara Perez MRN: [id number] Date of Birth: [birth year] Referring Provider: Erasmo Downer   Encounter Date: 10/13/2017  PT End of Session - 10/13/17 2139    Visit Number  2    Number of Visits  8    Date for PT Re-Evaluation  12/02/17    Authorization Type  BCBS    PT Start Time  1605    PT Stop Time  1705    PT Time Calculation (min)  60 min    Activity Tolerance  Patient tolerated treatment well    Behavior During Therapy  [facility] for tasks assessed/performed       Past Medical History:  Diagnosis Date  . Allergy   . Depression    postpartum  . 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 Raynaud's disease   . Urinary incontinence     Past Surgical History:  Procedure Laterality Date  . ADENOIDECTOMY    . TONSILLECTOMY    . TYMPANOSTOMY    . WISDOM TOOTH EXTRACTION      There were no vitals filed for this visit.    Pelvic Floor Physical Therapy Treatment Note  SCREENING  Changes in medications, allergies, or medical history?: no     SUBJECTIVE  Patient reports: Doing her exercises most days of the week. Feels that she is getting stronger in her TA.    Pain Update:  Location of pain: LBP Current pain: 0/10  Max pain: 6/10 Least pain: 0/10 Nature of pain:achy  Patient Goals: Decrease SUI, LBP, and constipation.   OBJECTIVE  Pelvic Floor External Exam: Introitus Appears: mildly gaping Skin integrity: normal Palpation: no TTP Cough: paradoxical Prolapse visible?: no Scar mobility: N/A  Internal Vaginal Exam: Strength (PERF): 1=/5 before training, 3+/5 with training. Palpation:TTP through PR, OI, and coccygeus on R, through IC, OI, and coccygeus on L. Prolapse: anterior wall visualized  near level of introitus.   Changes in: Posture/Observations:  Forward rounded shoulders and increased lordosis.   INTERVENTIONS THIS SESSION: NM re-ed: posture training in mirror as well as pelvic tilts on ball to decrease lordosis and improve proprioception of what "good" posture feels like. Worked on PFM coordination with TA contraction and breathing to allow greatest PFM recruitment and facilitate strengthening. Manual: assessed PFM, performed TP release to PFM B, greater trigger points  anterior on R, posterior on L resolution of all TP's addressed.     Total time: 60 min.                        PT Education - 10/13/17 2139    Education provided  Yes    Education Details  see Pt. instructions and Interventions this session    Person(s) Educated  Patient    Methods  Explanation;Demonstration;Tactile cues;Verbal cues;Handout    Comprehension  Verbalized understanding;Returned demonstration;Verbal cues required;Tactile cues required;Need further instruction       PT Short Term Goals - 10/08/17 1507      PT SHORT TERM GOAL #1   Title  Patient will demonstrate a coordinated contraction, relaxation, and bulge of the pelvic floor muscles to demonstrate functional recruitment and motion and allow for further strengthening.    Time  4    Period  Weeks    Status  New    Target Date  11/03/17      PT SHORT TERM GOAL #2   Title  Patient will demonstrate functional recruitment of TA with breathing, sit-to-stand, squatting/lifting, and walking to allow for improved pelvic brace coordination, improved balance, and decreased downward pressure on the pelvic organs,.    Time  4    Period  Weeks    Status  New    Target Date  11/03/17      PT SHORT TERM GOAL #3   Title  Patient will report consistent use of foot-stool (squatty-potty) for positioning with BM to decrease pain with BM and intra-abdominal pressure.    Time  4    Period  Weeks    Status  New    Target  Date  11/03/17        PT Long Term Goals - 10/08/17 1516      PT LONG TERM GOAL #1   Title  Patient will report no episodes of SUI over the course of the prior two weeks to demonstrate improved functional ability.    Time  8    Period  Weeks    Status  New    Target Date  11/30/17      PT LONG TERM GOAL #2   Title  Patient will report no pain with intercourse to demonstrate improved functional ability.    Time  8    Period  Weeks    Target Date  11/30/17      PT LONG TERM GOAL #3   Title  Patient will report having BM's at least every-other day with consistency between [facility] stool scale 3-5 over the prior week to demonstrate decreased constipation.    Time  8    Period  Weeks    Status  New    Target Date  11/30/17      PT LONG TERM GOAL #4   Title  Patient will score at or below 23 on the PFDI and 10 on the PFIQ to demonstrate a clinically meaningful decrease in disability and distress due to pelvic floor dysfunction.    Baseline  PFDI: 68/300, PFIQ: 20/300    Time  8    Period  Weeks            Plan - 10/13/17 2141    Clinical Impression Statement  Internal assessment today revealed a combination of poor PFM coordination, weakness, and spasms. Patient responded well to all interventions and demonstrated 2 point increase in PFM "strength" after coordination training and TP release. Continue per POC.    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     PT Frequency  1x / week    PT Duration  8 weeks    PT Treatment/Interventions  ADLs/Self Care Home Management;Biofeedback;Electrical Stimulation;Stair training;Functional mobility training;Neuromuscular re-education;Balance training;Therapeutic exercise;Therapeutic activities;Patient/family education;Manual  techniques;Dry needling;Taping    PT Next Visit Plan  TP release and MFR to quads and adductors. tall kneeling, mini-marches, multi-direction stepping.    PT Home Exercise Plan  Diaphragmatic breathing and TA activation, kegels, posture.    Consulted and Agree with Plan of Care  Patient       Patient will benefit from skilled therapeutic intervention in order to improve the  following deficits and impairments:  Increased fascial restricitons, Improper body mechanics, Pain, Decreased coordination, Increased muscle spasms, Postural dysfunction, Decreased activity tolerance, Decreased strength, Decreased range of motion, Decreased balance  Visit Diagnosis: Myalgia  Muscle weakness (generalized)  Other lack of coordination  Stress incontinence of urine  Constipation, unspecified constipation type     Problem List Patient Active Problem List   Diagnosis Date Noted  . Community acquired pneumonia 09/23/2017  . Raynaud's disease   . Urinary incontinence    Cleophus MoltKeeli T. Gailes DPT, ATC Cleophus MoltKeeli T Gailes 10/13/2017, 9:45 PM  [facility] [address] [place], [place], [address] Phone: [phone]   Fax:  [phone]  Name: Marzetta BoardMegan D Cureton MRN: [id number] Date of Birth: 10/08/1985

## 2017-10-20 ENCOUNTER — Ambulatory Visit: Payer: BC Managed Care – PPO

## 2017-10-20 DIAGNOSIS — R278 Other lack of coordination: Secondary | ICD-10-CM

## 2017-10-20 DIAGNOSIS — M791 Myalgia, unspecified site: Secondary | ICD-10-CM | POA: Diagnosis not present

## 2017-10-20 DIAGNOSIS — M6281 Muscle weakness (generalized): Secondary | ICD-10-CM

## 2017-10-20 DIAGNOSIS — N393 Stress incontinence (female) (male): Secondary | ICD-10-CM

## 2017-10-20 DIAGNOSIS — K5900 Constipation, unspecified: Secondary | ICD-10-CM

## 2017-10-20 NOTE — Patient Instructions (Signed)
   Activate and draw up the pelvic floor and tuck pelvis slightly under by squeezing the lower tummy muscles and glutes. Hold up to 10 seconds, resting when the PF becomes tired. Repeat ___ times, ____ times per day.    Make sure the hips are both pointed forward and that the knee is not forward past the toes. Activate and draw up the pelvic floor and tuck pelvis slightly under by squeezing the lower tummy muscles and glutes. Hold up to 10 seconds, resting when the PF becomes tired. Repeat _10x3__ times, _1-2___ times per day.  *Add big, gentle arm swings to challenge your balance.        Stand with equal weight on both feet engage the pelvic floor and lower abdomen and then lunge with the same leg 5 times in each direction, keeping foot forward and balancing for a second before placing the foot down between repetitions.  _2__ reps __1-2_ times per day.

## 2017-10-20 NOTE — Therapy (Signed)
[facility] MAIN [facility] SERVICES [address] [place], [place], [address] Phone: [phone]   Fax:  [phone]  Physical Therapy Treatment  Patient Details  Name: Barbara Perez MRN: [id number] Date of Birth: May 14, 1986 Referring Provider: Erasmo Downer   Encounter Date: 10/20/2017  PT End of Session - 10/21/17 1447    Visit Number  3    Number of Visits  8    Date for PT Re-Evaluation  12/02/17    Authorization Type  BCBS    PT Start Time  1610    PT Stop Time  1722    PT Time Calculation (min)  72 min    Activity Tolerance  Patient tolerated treatment well    Behavior During Therapy  [facility] for tasks assessed/performed       Past Medical History:  Diagnosis Date  . Allergy   . Depression    postpartum  . 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 Raynaud's disease   . Urinary incontinence     Past Surgical History:  Procedure Laterality Date  . ADENOIDECTOMY    . TONSILLECTOMY    . TYMPANOSTOMY    . WISDOM TOOTH EXTRACTION      There were no vitals filed for this visit.    Pelvic Floor Physical Therapy Treatment Note  SCREENING  Changes in medications, allergies, or medical history?: no  SUBJECTIVE  Patient reports: No major change, had some really busy days at work and has some soreness from being on her feet a lot. Has not had any leakage since starting to blowdry her hair ina stretching position.   Pain update:  Location of pain: low and mid-back Current pain:  0/10  Max pain:  3/10 Least pain:  0/10 Nature of pain: achy  Patient Goals: Decrease SUI, LBP, and constipation.   OBJECTIVE  Changes in: Posture/Observations:  Patient is able to actively engage TA better but continues to want to over-use the obliques in standing.  Range of Motion/Flexibilty:  Patient was able to attain more posterior pelvic tilt (hip extension) to achieve position for  tall-kneel exercise following manual treatment.  Palpation: Multiple TP's through adductors and a lot of myofascial restriction near lateral hip B.  Gait Analysis: Anterior pelvic tilt  INTERVENTIONS THIS SESSION: Manual: hold-relax, TP release and MFR using edge tool to B adductors to decrease tension acting on pelvis, improve ability to attain improved posture, and decrease internal TTP.  Therex: Tall kneeling with TA and shoulder retraction as well as arm swings and multi-directional stepping with TA contraction for functional integration of pelvic brace statically and dynamically.  Total time: 72 min.                        PT Education - 10/21/17 1447    Education provided  Yes    Education Details  see Pt. instructions and interventions this session.    Person(s) Educated  Patient    Methods  Explanation;Demonstration;Tactile cues;Handout;Verbal cues    Comprehension  Verbalized understanding;Returned demonstration;Verbal cues required;Tactile cues required;Need further instruction       PT Short Term Goals - 10/08/17 1507      PT SHORT TERM GOAL #1   Title  Patient will demonstrate a coordinated contraction, relaxation, and bulge of the pelvic floor muscles to demonstrate functional recruitment and motion and allow for further strengthening.    Time  4    Period  Weeks    Status  New    Target Date  11/03/17      PT SHORT TERM GOAL #2   Title  Patient will demonstrate functional recruitment of TA with breathing, sit-to-stand, squatting/lifting, and walking to allow for improved pelvic brace coordination, improved balance, and decreased downward pressure on the pelvic organs,.    Time  4    Period  Weeks    Status  New    Target Date  11/03/17      PT SHORT TERM GOAL #3   Title  Patient will report consistent use of foot-stool (squatty-potty) for positioning with BM to decrease pain with BM and intra-abdominal pressure.    Time  4    Period  Weeks     Status  New    Target Date  11/03/17        PT Long Term Goals - 10/08/17 1516      PT LONG TERM GOAL #1   Title  Patient will report no episodes of SUI over the course of the prior two weeks to demonstrate improved functional ability.    Time  8    Period  Weeks    Status  New    Target Date  11/30/17      PT LONG TERM GOAL #2   Title  Patient will report no pain with intercourse to demonstrate improved functional ability.    Time  8    Period  Weeks    Target Date  11/30/17      PT LONG TERM GOAL #3   Title  Patient will report having BM's at least every-other day with consistency between [facility] stool scale 3-5 over the prior week to demonstrate decreased constipation.    Time  8    Period  Weeks    Status  New    Target Date  11/30/17      PT LONG TERM GOAL #4   Title  Patient will score at or below 23 on the PFDI and 10 on the PFIQ to demonstrate a clinically meaningful decrease in disability and distress due to pelvic floor dysfunction.    Baseline  PFDI: 68/300, PFIQ: 20/300    Time  8    Period  Weeks            Plan - 10/21/17 1448    Clinical Impression Statement  Patient responded well to all interventions today but will require some review of exercises at next session to make sure form carried-over into practice at home. In-session improvement in hip extension and decreased pain/spasm.     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     PT Frequency  1x / week    PT Duration  8 weeks    PT Treatment/Interventions  ADLs/Self Care Home Management;Biofeedback;Electrical Stimulation;Stair training;Functional mobility training;Neuromuscular re-education;Balance training;Therapeutic exercise;Therapeutic activities;Patient/family education;Manual  techniques;Dry needling;Taping    PT Next Visit Plan  Mini-marches. Internal TP release, MFR to low back/TP release    PT Home Exercise Plan  Diaphragmatic breathing and TA activation, kegels, posture, Tall kneeling, multi-directional stepping.    Consulted and Agree with Plan of Care  Patient       Patient will benefit from skilled therapeutic intervention  in order to improve the following deficits and impairments:  Increased fascial restricitons, Improper body mechanics, Pain, Decreased coordination, Increased muscle spasms, Postural dysfunction, Decreased activity tolerance, Decreased strength, Decreased range of motion, Decreased balance  Visit Diagnosis: Myalgia  Other lack of coordination  Muscle weakness (generalized)  Stress incontinence of urine  Constipation, unspecified constipation type     Problem List Patient Active Problem List   Diagnosis Date Noted  . Community acquired pneumonia 09/23/2017  . Raynaud's disease   . Urinary incontinence    Cleophus MoltKeeli T. Adylee Leonardo DPT, ATC Cleophus MoltKeeli T Craig Ionescu 10/21/2017, 2:54 PM  [facility] [address] [place], [place], [address] Phone: [phone]   Fax:  [phone]  Name: Barbara Perez MRN: [id number] Date of Birth: 10/03/1985

## 2017-10-27 ENCOUNTER — Ambulatory Visit: Payer: BC Managed Care – PPO | Attending: Family Medicine

## 2017-10-27 DIAGNOSIS — K5900 Constipation, unspecified: Secondary | ICD-10-CM | POA: Diagnosis present

## 2017-10-27 DIAGNOSIS — N393 Stress incontinence (female) (male): Secondary | ICD-10-CM | POA: Insufficient documentation

## 2017-10-27 DIAGNOSIS — M791 Myalgia, unspecified site: Secondary | ICD-10-CM | POA: Insufficient documentation

## 2017-10-27 DIAGNOSIS — R278 Other lack of coordination: Secondary | ICD-10-CM | POA: Insufficient documentation

## 2017-10-27 DIAGNOSIS — M6281 Muscle weakness (generalized): Secondary | ICD-10-CM | POA: Diagnosis present

## 2017-10-27 NOTE — Therapy (Signed)
[facility] [address] [place], [place], [address] Phone: [phone]   Fax:  [phone]  Physical Therapy Treatment  Patient Details  Name: Barbara Perez MRN: [id number] Date of Birth: 07/25/1986 Referring Provider: Erasmo DownerBacigalupo, Angela M   Encounter Date: 10/27/2017  PT End of Session - 10/27/17 1720    Visit Number  4    Number of Visits  8    Date for PT Re-Evaluation  12/02/17    Authorization Type  BCBS    PT Start Time  1605    PT Stop Time  1715    PT Time Calculation (min)  70 min    Activity Tolerance  Patient tolerated treatment well    Behavior During Therapy  [facility] for tasks assessed/performed       Past Medical History:  Diagnosis Date  . Allergy   . Depression    postpartum  . 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 Raynaud's disease   . Urinary incontinence     Past Surgical History:  Procedure Laterality Date  . ADENOIDECTOMY    . TONSILLECTOMY    . TYMPANOSTOMY    . WISDOM TOOTH EXTRACTION      There were no vitals filed for this visit.    Pelvic Floor Physical Therapy Treatment Note  SCREENING  Changes in medications, allergies, or medical history?: no     SUBJECTIVE  Patient reports: 1-2 instances of leakage. Once at work with a cough/sneeze. The other at home with stress. Had an "episode" this week but believes it was due to something she ate. Has not had any BM's since then which is not abnormal for her.   Pain update:  Location of pain: low back up to mid-thoracic. Current pain:  0/10  Max pain:  2/10 Least pain:  0/10 Nature of pain: achy  Patient Goals: Decrease SUI, LBP, and constipation.   OBJECTIVE  Changes in:  Abdominal:  Patient demonstrated limited ability to recruit TA in supine following manual and improved activation following.  Palpation: TTP to SIJ B and erector spinae/Obliques B with  L>R.  INTERVENTIONS THIS SESSION: Manual: TP release and MFR to thoracolumbar fascia, obliques, and rectus abdominus B to improve fascial motion, decrease inhibition of TA, and improve recruitment of TA and multifidi to decrease LBP, improve pelvic positioning, and allow for relaxation/optimal recruitment of the PFM. Self-care: Educated on use of Squatty-potty, colonic massage, and bowel retraining program to improve her ability to have regular BM's with less straining. NM re-ed: reviewed TA in quadruped, had Pt. Attempt in supine before and after manual treatment with greater success following treatment. Patient educated on how to recruit TA and multifidi in conjunction with exhale and how to maintain stability with dynamic motion through mini-marches.  Total time: 70 min.                         PT Education - 10/27/17 1720    Education provided  Yes    Education Details  see Pt. Instructions and interventions this session    Person(s) Educated  Patient    Methods  Demonstration;Explanation;Tactile cues;Verbal cues;Handout    Comprehension  Verbalized understanding;Returned demonstration;Verbal cues required;Tactile cues required       PT Short Term Goals - 10/08/17 1507      PT SHORT TERM GOAL #1   Title  Patient will  demonstrate a coordinated contraction, relaxation, and bulge of the pelvic floor muscles to demonstrate functional recruitment and motion and allow for further strengthening.    Time  4    Period  Weeks    Status  New    Target Date  11/03/17      PT SHORT TERM GOAL #2   Title  Patient will demonstrate functional recruitment of TA with breathing, sit-to-stand, squatting/lifting, and walking to allow for improved pelvic brace coordination, improved balance, and decreased downward pressure on the pelvic organs,.    Time  4    Period  Weeks    Status  New    Target Date  11/03/17      PT SHORT TERM GOAL #3   Title  Patient will report consistent  use of foot-stool (squatty-potty) for positioning with BM to decrease pain with BM and intra-abdominal pressure.    Time  4    Period  Weeks    Status  New    Target Date  11/03/17        PT Long Term Goals - 10/08/17 1516      PT LONG TERM GOAL #1   Title  Patient will report no episodes of SUI over the course of the prior two weeks to demonstrate improved functional ability.    Time  8    Period  Weeks    Status  New    Target Date  11/30/17      PT LONG TERM GOAL #2   Title  Patient will report no pain with intercourse to demonstrate improved functional ability.    Time  8    Period  Weeks    Target Date  11/30/17      PT LONG TERM GOAL #3   Title  Patient will report having BM's at least every-other day with consistency between [facility] stool scale 3-5 over the prior week to demonstrate decreased constipation.    Time  8    Period  Weeks    Status  New    Target Date  11/30/17      PT LONG TERM GOAL #4   Title  Patient will score at or below 23 on the PFDI and 10 on the PFIQ to demonstrate a clinically meaningful decrease in disability and distress due to pelvic floor dysfunction.    Baseline  PFDI: 68/300, PFIQ: 20/300    Time  8    Period  Weeks            Plan - 10/27/17 1722    Clinical Impression Statement  Patient responded well to all interventions today demonstrating improved lumbothoracic fascia mobility and improved TA recruitment following manual treatment. Patient demonstrated understanding of all education and intent to follow through with suggestions. Continue per POC.    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     PT Frequency  1x / week    PT Treatment/Interventions  ADLs/Self Care Home Management;Biofeedback;Electrical  Stimulation;Stair training;Functional mobility training;Neuromuscular re-education;Balance training;Therapeutic exercise;Therapeutic activities;Patient/family education;Manual techniques;Dry needling;Taping    PT Next Visit Plan  Internal TP release, review HEP and ST goals.    PT Home Exercise Plan  Diaphragmatic breathing and TA activation, kegels, posture, Tall kneeling, multi-directional stepping, squatty-potty, ILY massage and bowel retraining.  Mini-marches    Consulted and Agree with Plan of Care  Patient       Patient will benefit from skilled therapeutic intervention in order to improve the following deficits and impairments:  Increased fascial restricitons, Improper body mechanics, Pain, Decreased coordination, Increased muscle spasms, Postural dysfunction, Decreased activity tolerance, Decreased strength, Decreased range of motion, Decreased balance  Visit Diagnosis: Myalgia  Other lack of coordination  Muscle weakness (generalized)  Stress incontinence of urine  Constipation, unspecified constipation type     Problem List Patient Active Problem List   Diagnosis Date Noted  . Community acquired pneumonia 09/23/2017  . Raynaud's disease   . Urinary incontinence    Cleophus Molt DPT, ATC Cleophus Molt 10/27/2017, 5:35 PM  [facility] MAIN [facility] SERVICES [address] [place], [place], [address] Phone: [phone]   Fax:  [phone]  Name: Barbara Perez MRN: [id number] Date of Birth: [birth year]

## 2017-10-27 NOTE — Patient Instructions (Signed)
   The "I Love You" massage for your colon  Start by resting or lying quietly. 1. Using your fingertips, you apply light pressure in a stroking motion. 2. Start with your hands on the left hand side of your abdomen, below the rib cage, and stroke or make small circles down towards your left hip. This is the "I" of the "I Love You" massage. 3. Next, you are going to make the strokes in an upside down "L" shape. Run your fingertips from the right side of your upper abdomen, across under your ribs, and down the left side. 4. Now you are going to run through the whole path. This is the "U". Start on the bottom right of your abdomen. Stroke up the right side, across under the rib cage, and down the left side.   5. Finally, Using your fingertips, you apply light pressure in small circles  through the whole "U" path to "wake up" the smooth muscles of the intestines and get things moving.    Up the right, across under the rib cage, down the left and inwards, moving in a clockwise motion (if you are looking down upon your own abdomen)  Essentially you are massaging along the path of your large intestine. Our colon starts roughly in the bottom right of our abdomen, travels up the right hand side, turns and runs across below our rib cage, and then down the left side and in towards the pubic bone. When I teach this massage for people to do at home I have them start with 10 minutes. However, anecdotally, many people tell me 15-20 minutes really gets things going! After about 5 minutes of this massage my insides start gurgling and making noises. 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 A big problem is constipation resulting from either medication side effects, post surgical changes, or the fact that in general people in the hospital don't move as much (and exercise such as walking also helps regulate our digestive system). One of the first "exercises" I would teach them is how to do  the "I Love You" abdominal massage. Time and time again I have people come back to me and say that massaging their abdominal tissue helped their digestive issues. Give it a try today!  *Adapted from article written by Harriet ButteKara Schuft, PT, DPT    Mini-Marches    Exhale, drawing the lower tummy (TA) in toward the back bone and hold contraction while you lift one foot ~ 2 inches off the mat, then the other foot before relaxing and resetting. Try to keep your hips from rocking, using your hands to sense whether they are staying even as pictured.      Perform __10_ repetitions for _2-3__ sets. Do this _1_ times per day.

## 2017-11-04 ENCOUNTER — Ambulatory Visit: Payer: BC Managed Care – PPO

## 2017-11-04 DIAGNOSIS — M6281 Muscle weakness (generalized): Secondary | ICD-10-CM

## 2017-11-04 DIAGNOSIS — M791 Myalgia, unspecified site: Secondary | ICD-10-CM | POA: Diagnosis not present

## 2017-11-04 DIAGNOSIS — N393 Stress incontinence (female) (male): Secondary | ICD-10-CM

## 2017-11-04 DIAGNOSIS — R278 Other lack of coordination: Secondary | ICD-10-CM

## 2017-11-04 DIAGNOSIS — K5900 Constipation, unspecified: Secondary | ICD-10-CM

## 2017-11-04 NOTE — Therapy (Signed)
[place] MAIN [facility] SERVICES [address] [place], [place], [address] Phone: [phone]   Fax:  [phone]  Physical Therapy Treatment  Patient Details  Name: Barbara Perez MRN: [id number] Date of Birth: [birth year] Referring Provider: Erasmo Downer   Encounter Date: 11/04/2017  PT End of Session - 11/05/17 2239    Visit Number  5    Number of Visits  8    Date for PT Re-Evaluation  12/02/17    Authorization Type  BCBS    PT Start Time  1600    PT Stop Time  1700    PT Time Calculation (min)  60 min    Activity Tolerance  Patient tolerated treatment well    Behavior During Therapy  [facility] for tasks assessed/performed       Past Medical History:  Diagnosis Date  . Allergy   . Depression    postpartum  . 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 Raynaud's disease   . Urinary incontinence     Past Surgical History:  Procedure Laterality Date  . ADENOIDECTOMY    . TONSILLECTOMY    . TYMPANOSTOMY    . WISDOM TOOTH EXTRACTION      There were no vitals filed for this visit.    Pelvic Floor Physical Therapy Treatment Note  SCREENING  Changes in medications, allergies, or medical history?: no    SUBJECTIVE  Patient reports: Had one episode of leakage when coughing and had a very full bladder. She has been really trying to not include glutes and other accessory muscles when doing kegels.  Pain update: No c/o pain  Patient Goals: Decrease SUI, LBP, and constipation.   OBJECTIVE  Changes in:  Pelvic floor: 3+/5 and coordinated. TP through B coccygeus and R OI (less).  INTERVENTIONS THIS SESSION: Manual: re-assessed PFM, TP release to Coccygeus B for decreased pelvic floor spasm and improved recruitment for SUI reduction. NM re-ed: Performed taping to posterior shoulders into retraction to improve posture and across the TA to improve pelvic position for improved  PFM recruitment. Educated on and practiced A/P glides in quad to improve coordination of deep core stabilizers to allow for improved TA recruitment for posture.  Total time: 65 min.                         PT Education - 11/05/17 2238    Education provided  Yes    Education Details  See Pt. Instructions and Interventions this session    Person(s) Educated  Patient    Methods  Explanation;Demonstration;Tactile cues;Verbal cues;Handout    Comprehension  Verbalized understanding;Returned demonstration;Verbal cues required;Tactile cues required       PT Short Term Goals - 11/04/17 1631      PT SHORT TERM GOAL #1   Title  Patient will demonstrate a coordinated contraction, relaxation, and bulge of the pelvic floor muscles to demonstrate functional recruitment and motion and allow for further strengthening.    Time  4    Period  Weeks    Status  Achieved    Target Date  11/03/17      PT SHORT TERM GOAL #2   Title  Patient will demonstrate functional recruitment of TA with breathing, sit-to-stand, squatting/lifting, and walking to allow for improved pelvic brace coordination, improved balance, and decreased downward pressure on the pelvic organs,.    Time  4  Period  Weeks    Status  Achieved    Target Date  11/03/17      PT SHORT TERM GOAL #3   Title  Patient will report consistent use of foot-stool (squatty-potty) for positioning with BM to decrease pain with BM and intra-abdominal pressure.    Time  4    Period  Weeks    Status  On-going    Target Date  11/03/17        PT Long Term Goals - 10/08/17 1516      PT LONG TERM GOAL #1   Title  Patient will report no episodes of SUI over the course of the prior two weeks to demonstrate improved functional ability.    Time  8    Period  Weeks    Status  New    Target Date  11/30/17      PT LONG TERM GOAL #2   Title  Patient will report no pain with intercourse to demonstrate improved functional ability.     Time  8    Period  Weeks    Target Date  11/30/17      PT LONG TERM GOAL #3   Title  Patient will report having BM's at least every-other day with consistency between [facility] stool scale 3-5 over the prior week to demonstrate decreased constipation.    Time  8    Period  Weeks    Status  New    Target Date  11/30/17      PT LONG TERM GOAL #4   Title  Patient will score at or below 23 on the PFDI and 10 on the PFIQ to demonstrate a clinically meaningful decrease in disability and distress due to pelvic floor dysfunction.    Baseline  PFDI: 68/300, PFIQ: 20/300    Time  8    Period  Weeks            Plan - 11/05/17 2240    Clinical Impression Statement  Patient continues to respond well to manual therapy, exercises, and education. She demonstrated improved recruitment today after TP release and with cueing to purse lips. Continue per POC.    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     PT Frequency  1x / week    PT Duration  8 weeks    PT Treatment/Interventions  ADLs/Self Care Home Management;Biofeedback;Electrical Stimulation;Stair training;Functional mobility training;Neuromuscular re-education;Balance training;Therapeutic exercise;Therapeutic activities;Patient/family education;Manual techniques;Dry needling;Taping    PT Next Visit Plan  PA to thoracic spine and sacrum, Psoas release, further TA strengthening.    PT Home Exercise Plan  Diaphragmatic breathing and TA activation, kegels, posture, Tall kneeling, multi-directional stepping, squatty-potty, ILY massage and bowel retraining. Mini-marches    Consulted and Agree with Plan of Care  Patient       Patient will benefit from skilled therapeutic intervention in order to improve the following deficits and  impairments:  Increased fascial restricitons, Improper body mechanics, Pain, Decreased coordination, Increased muscle spasms, Postural dysfunction, Decreased activity tolerance, Decreased strength, Decreased range of motion, Decreased balance  Visit Diagnosis: Myalgia  Other lack of coordination  Muscle weakness (generalized)  Stress incontinence of urine  Constipation, unspecified constipation type     Problem List Patient Active Problem List   Diagnosis  Date Noted  . Community acquired pneumonia 09/23/2017  . Raynaud's disease   . Urinary incontinence    Cleophus MoltKeeli T. Dan Dissinger DPT, ATC Cleophus MoltKeeli T Jenasia Dolinar 11/05/2017, 11:15 PM  [place] MAIN [facility] SERVICES [address] [place], [place], [address] Phone: [phone]   Fax:  [phone]  Name: Marzetta BoardMegan D Mossbarger MRN: [id number] Date of Birth: 07/12/1986

## 2017-11-04 NOTE — Patient Instructions (Signed)
   Keep the back flat and even as you slowly and smoothly shift your weight back toward your heels and then forward over your wrists. You can use a small ball in the small of your back or a mirror to help you know your back is flat.   Rock back and forth _10__ times _1__ times per day.  Stabilization: Sit to Stand Transfer, Pelvic Floor Contraction    Sit, feet flat, breathe in and lean forward then exhale and contract pelvic floor as if stopping urination as you stand.       Press out into the strap and hold for 5 seconds then release and "take up the slack". Repeat 5 times and then hold a 30 second static stretch. One for each side daily.   * With kegel, make sure your back is flat when you begin and purse your lips to get the brain to recognize your muscles as a sphincter and give you a stronger contraction.

## 2017-11-13 ENCOUNTER — Ambulatory Visit: Payer: BC Managed Care – PPO

## 2017-11-13 DIAGNOSIS — K5900 Constipation, unspecified: Secondary | ICD-10-CM

## 2017-11-13 DIAGNOSIS — M791 Myalgia, unspecified site: Secondary | ICD-10-CM

## 2017-11-13 DIAGNOSIS — R278 Other lack of coordination: Secondary | ICD-10-CM

## 2017-11-13 DIAGNOSIS — M6281 Muscle weakness (generalized): Secondary | ICD-10-CM

## 2017-11-13 DIAGNOSIS — N393 Stress incontinence (female) (male): Secondary | ICD-10-CM

## 2017-11-13 NOTE — Therapy (Addendum)
[facility] MAIN [facility] SERVICES [address] [place], [place], [address] Phone: [phone]   Fax:  [phone]  Physical Therapy Treatment  Patient Details  Name: Barbara Perez MRN: [id number] Date of Birth: Feb 09, 1986 Referring Provider: Erasmo Downer   Encounter Date: 11/13/2017  PT End of Session - 11/17/17 0818    Visit Number  6    Number of Visits  8    Date for PT Re-Evaluation  12/02/17    Authorization Type  BCBS    PT Start Time  1635    PT Stop Time  1730    PT Time Calculation (min)  55 min    Activity Tolerance  Patient tolerated treatment well    Behavior During Therapy  [facility] for tasks assessed/performed       Past Medical History:  Diagnosis Date  . Allergy   . Depression    postpartum  . 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 Raynaud's disease   . Urinary incontinence     Past Surgical History:  Procedure Laterality Date  . ADENOIDECTOMY    . TONSILLECTOMY    . TYMPANOSTOMY    . WISDOM TOOTH EXTRACTION      There were no vitals filed for this visit.    Pelvic Floor Physical Therapy Treatment Note  SCREENING  Changes in medications, allergies, or medical history?: no     SUBJECTIVE  Patient reports: She only had two episodes of leakage but has been very busy and did not take as much time to pay attention to her exercises.  Precautions:    Pain update:  Location of pain: L SIJ Current pain:  0/10  Max pain:  3/10 Least pain:  0/10 Nature of pain: dull ache  Patient Goals: Decrease SUI, LBP, and constipation.    OBJECTIVE  Changes in:  ROM/Mobility: Patient demonstrated pain and immobility of SIJ B and the entirety of the thoracic spine and C/T junction which is limiting her ability to have appropriate posture and pelvic position to let the PFM work appropriately.   INTERVENTIONS THIS SESSION: Manual: Grade 3-4 mobs to sacrum  and thoracic spine as well as C-T junction and TP release to paraspinals near C-T junction to improve spinal mobility and improve Pt's ability to attain improved posture and recruit TA. Educated on thoracic extension over foam roller to continue to improve and to maintain mobility of the spine.   Total time: 55 min.                         PT Education - 11/17/17 0817    Education provided  Yes    Education Details  See Pt. Instructions and interventions this session    Person(s) Educated  Patient    Methods  Explanation;Demonstration;Tactile cues;Verbal cues;Handout    Comprehension  Verbalized understanding;Returned demonstration;Verbal cues required;Tactile cues required       PT Short Term Goals - 11/04/17 1631      PT SHORT TERM GOAL #1   Title  Patient will demonstrate a coordinated contraction, relaxation, and bulge of the pelvic floor muscles to demonstrate functional recruitment and motion and allow for further strengthening.    Time  4    Period  Weeks    Status  Achieved    Target Date  11/03/17      PT SHORT TERM GOAL #2  Title  Patient will demonstrate functional recruitment of TA with breathing, sit-to-stand, squatting/lifting, and walking to allow for improved pelvic brace coordination, improved balance, and decreased downward pressure on the pelvic organs,.    Time  4    Period  Weeks    Status  Achieved    Target Date  11/03/17      PT SHORT TERM GOAL #3   Title  Patient will report consistent use of foot-stool (squatty-potty) for positioning with BM to decrease pain with BM and intra-abdominal pressure.    Time  4    Period  Weeks    Status  On-going    Target Date  11/03/17        PT Long Term Goals - 10/08/17 1516      PT LONG TERM GOAL #1   Title  Patient will report no episodes of SUI over the course of the prior two weeks to demonstrate improved functional ability.    Time  8    Period  Weeks    Status  New    Target Date   11/30/17      PT LONG TERM GOAL #2   Title  Patient will report no pain with intercourse to demonstrate improved functional ability.    Time  8    Period  Weeks    Target Date  11/30/17      PT LONG TERM GOAL #3   Title  Patient will report having BM's at least every-other day with consistency between [facility] stool scale 3-5 over the prior week to demonstrate decreased constipation.    Time  8    Period  Weeks    Status  New    Target Date  11/30/17      PT LONG TERM GOAL #4   Title  Patient will score at or below 23 on the PFDI and 10 on the PFIQ to demonstrate a clinically meaningful decrease in disability and distress due to pelvic floor dysfunction.    Baseline  PFDI: 68/300, PFIQ: 20/300    Time  8    Period  Weeks            Plan - 11/17/17 1610    Clinical Impression Statement  Patient responded well to all intervention today, demonstrating improved mobility and ability to recruit TA in standing following manual treatment today. Continue per POC.    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     PT Frequency  1x / week    PT Duration  8 weeks    PT Treatment/Interventions  ADLs/Self Care Home Management;Biofeedback;Electrical Stimulation;Stair training;Functional mobility training;Neuromuscular re-education;Balance training;Therapeutic exercise;Therapeutic activities;Patient/family education;Manual techniques;Dry needling;Taping    PT Next Visit Plan  psoas release, shoulder retractions/chin tucks, upgrade to half kneeling replace quad TA with A/P shifts. check adductors again and obliques.    PT Home Exercise Plan  Diaphragmatic breathing and TA activation, kegels, posture, Tall kneeling, multi-directional stepping, squatty-potty, ILY massage and bowel  retraining. Mini-marches    Consulted and Agree with Plan of Care  Patient       Patient will benefit from skilled therapeutic intervention in order to improve the following deficits and impairments:  Increased fascial restricitons, Improper body mechanics, Pain, Decreased coordination, Increased muscle spasms, Postural dysfunction, Decreased  activity tolerance, Decreased strength, Decreased range of motion, Decreased balance  Visit Diagnosis: Myalgia  Other lack of coordination  Muscle weakness (generalized)  Stress incontinence of urine  Constipation, unspecified constipation type     Problem List Patient Active Problem List   Diagnosis Date Noted  . Community acquired pneumonia 09/23/2017  . Raynaud's disease   . Urinary incontinence    Cleophus MoltKeeli T. Gailes DPT, ATC Cleophus MoltKeeli T Gailes 11/17/2017, 8:24 AM  [facility] MAIN [facility] SERVICES [address] [place], [place], [address] Phone: [phone]   Fax:  [phone]  Name: Barbara Perez MRN: [id number] Date of Birth: 09/23/1985

## 2017-11-13 NOTE — Patient Instructions (Signed)
   Place foam roller or towel under your upper back between your shoulder blades. Support your head with your hands, elbows forward, and gently rock back and forth and side to side to improve motion in your back.   Move the foam roller or towel up and down to a few spots in the upper back, repeating the process.    

## 2017-11-18 ENCOUNTER — Ambulatory Visit: Payer: BC Managed Care – PPO

## 2017-11-18 DIAGNOSIS — M791 Myalgia, unspecified site: Secondary | ICD-10-CM

## 2017-11-18 DIAGNOSIS — K5900 Constipation, unspecified: Secondary | ICD-10-CM

## 2017-11-18 DIAGNOSIS — R278 Other lack of coordination: Secondary | ICD-10-CM

## 2017-11-18 DIAGNOSIS — N393 Stress incontinence (female) (male): Secondary | ICD-10-CM

## 2017-11-18 DIAGNOSIS — M6281 Muscle weakness (generalized): Secondary | ICD-10-CM

## 2017-11-18 NOTE — Patient Instructions (Signed)
Try to vocalize a high pitch and strong note to help activate the lower tummy in your tall kneeling exercise and in standing. You can also play with squeezing and releasing the pelvic floor as you vocalize and note the change in pitch..Marland Kitchen

## 2017-11-18 NOTE — Therapy (Signed)
[facility] MAIN [facility] SERVICES [address] [place], [place], [address] Phone: [phone]   Fax:  [phone]  Physical Therapy Treatment  Patient Details  Name: Barbara Perez MRN: [id number] Date of Birth: [birth year] Referring Provider: Erasmo Downer   Encounter Date: 11/18/2017  PT End of Session - 11/18/17 1728    Visit Number  7    Number of Visits  8    Date for PT Re-Evaluation  12/02/17    Authorization Type  BCBS    PT Start Time  1611    PT Stop Time  1713    PT Time Calculation (min)  62 min    Activity Tolerance  Patient tolerated treatment well    Behavior During Therapy  [facility] for tasks assessed/performed       Past Medical History:  Diagnosis Date  . Allergy   . Depression    postpartum  . 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 Raynaud's disease   . Urinary incontinence     Past Surgical History:  Procedure Laterality Date  . ADENOIDECTOMY    . TONSILLECTOMY    . TYMPANOSTOMY    . WISDOM TOOTH EXTRACTION      There were no vitals filed for this visit.   Pelvic Floor Physical Therapy Treatment Note  SCREENING  Changes in medications, allergies, or medical history?: no     SUBJECTIVE  Patient reports: She has had no accidents in the last week! She had tenderness following prior visit and some pain up to a 4/10 near the SI joint. Had some redness where tape was on her stomach for ~ 1 day so decided not to use it at home.  Pain update:  Had 4/10 pain along thoracic spine after last session for 1-2 days but it recovered.  Patient Goals: Decrease SUI, LBP, and constipation.    OBJECTIVE  Changes in: Posture/Observations:  Improved neck and shoulder position but continues to be in anterior pelvic tilt. Was able to correct with verbal cueing in seated but not fully correctable actively in standing or kneeling yet.   INTERVENTIONS THIS  SESSION: Manual: TP release to psoas and iliacus B as well as obliques where they insert on the iliac crest to improve patient's ability to engage TA for better posture and improved ability to recruit the PFM. 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  NM re-ed: reviewed tall kneeling and AP shifts in quadruped to improve proprioception and coordination of muscles that act on the pelvis and improve Pt's awareness for postural correction.  Total time: 62 min           No data recorded               PT Education - 11/18/17 1726    Education provided  Yes    Education Details  See Pt. Instructions and interventions this session.    Person(s) Educated  Patient    Methods  Explanation;Demonstration;Tactile cues;Verbal cues;Handout       PT Short Term Goals - 11/04/17 1631      PT SHORT TERM GOAL #1   Title  Patient will demonstrate a coordinated contraction, relaxation, and bulge of the pelvic floor muscles to demonstrate functional recruitment and motion and allow for further strengthening.  Time  4    Period  Weeks    Status  Achieved    Target Date  11/03/17      PT SHORT TERM GOAL #2   Title  Patient will demonstrate functional recruitment of TA with breathing, sit-to-stand, squatting/lifting, and walking to allow for improved pelvic brace coordination, improved balance, and decreased downward pressure on the pelvic organs,.    Time  4    Period  Weeks    Status  Achieved    Target Date  11/03/17      PT SHORT TERM GOAL #3   Title  Patient will report consistent use of foot-stool (squatty-potty) for positioning with BM to decrease pain with BM and intra-abdominal pressure.    Time  4    Period  Weeks    Status  On-going    Target Date  11/03/17        PT Long Term Goals - 10/08/17 1516      PT  LONG TERM GOAL #1   Title  Patient will report no episodes of SUI over the course of the prior two weeks to demonstrate improved functional ability.    Time  8    Period  Weeks    Status  New    Target Date  11/30/17      PT LONG TERM GOAL #2   Title  Patient will report no pain with intercourse to demonstrate improved functional ability.    Time  8    Period  Weeks    Target Date  11/30/17      PT LONG TERM GOAL #3   Title  Patient will report having BM's at least every-other day with consistency between [facility] stool scale 3-5 over the prior week to demonstrate decreased constipation.    Time  8    Period  Weeks    Status  New    Target Date  11/30/17      PT LONG TERM GOAL #4   Title  Patient will score at or below 23 on the PFDI and 10 on the PFIQ to demonstrate a clinically meaningful decrease in disability and distress due to pelvic floor dysfunction.    Baseline  PFDI: 68/300, PFIQ: 20/300    Time  8    Period  Weeks            Plan - 11/18/17 1728    Clinical Impression Statement  Pt. responded well to all interventions today. Demonstrated improved ability to recruit TA following manual treatment and was able to wean off cueing with A/P shifts and tall kneel to min./none. Continue per POC.    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     PT Frequency  1x / week    PT Duration  8 weeks    PT Treatment/Interventions  ADLs/Self Care Home Management;Biofeedback;Electrical Stimulation;Stair training;Functional mobility training;Neuromuscular re-education;Balance training;Therapeutic exercise;Therapeutic activities;Patient/family education;Manual techniques;Dry needling;Taping    PT Next Visit Plan  shoulder retractions/chin tucks, upgrade to half kneeling.  check adductors again and obliques.    PT Home Exercise Plan  Diaphragmatic breathing and TA activation, kegels, posture, Tall kneeling, multi-directional stepping, squatty-potty, ILY massage and bowel retraining. Mini-marches, A/P shifts, fit-splint    Consulted and Agree with Plan of Care  Patient       Patient will benefit from skilled therapeutic intervention in order to improve the following deficits and impairments:  Increased fascial restricitons, Improper body mechanics, Pain, Decreased coordination, Increased muscle spasms, Postural dysfunction, Decreased activity tolerance, Decreased strength, Decreased range of motion, Decreased balance  Visit Diagnosis: Myalgia  Other lack of coordination  Muscle weakness (generalized)  Stress incontinence of urine  Constipation, unspecified constipation type     Problem List Patient Active Problem List   Diagnosis Date Noted  . Community acquired pneumonia 09/23/2017  . Raynaud's disease   . Urinary incontinence    Cleophus Molt DPT, ATC Cleophus Molt 11/18/2017, 5:31 PM  [facility] MAIN [facility] SERVICES [address] [place], [place], [address] Phone: [phone]   Fax:  [phone]  Name: CHESNI VOS MRN: [id number] Date of Birth: 21-Jun-1986

## 2017-11-25 ENCOUNTER — Ambulatory Visit: Payer: BC Managed Care – PPO | Attending: Family Medicine

## 2017-11-25 DIAGNOSIS — M791 Myalgia, unspecified site: Secondary | ICD-10-CM | POA: Insufficient documentation

## 2017-11-25 DIAGNOSIS — M6281 Muscle weakness (generalized): Secondary | ICD-10-CM | POA: Diagnosis present

## 2017-11-25 DIAGNOSIS — R278 Other lack of coordination: Secondary | ICD-10-CM | POA: Diagnosis present

## 2017-11-25 DIAGNOSIS — K5900 Constipation, unspecified: Secondary | ICD-10-CM | POA: Diagnosis present

## 2017-11-25 DIAGNOSIS — N393 Stress incontinence (female) (male): Secondary | ICD-10-CM | POA: Diagnosis present

## 2017-11-25 NOTE — Therapy (Signed)
[facility] MAIN [facility] SERVICES [address] [place], [place], [address] Phone: [phone]   Fax:  [phone]  Physical Therapy Treatment and Re-Eval  Patient Details  Name: Barbara Perez MRN: [id number] Date of Birth: 1986-02-12 Referring Provider: Erasmo Downer   Encounter Date: 11/25/2017  PT End of Session - 11/25/17 1854    Visit Number  8    Number of Visits  8    Date for PT Re-Evaluation  12/02/17    Authorization Type  BCBS    PT Start Time  1603    PT Stop Time  1713    PT Time Calculation (min)  70 min    Activity Tolerance  Patient tolerated treatment well    Behavior During Therapy  [facility] for tasks assessed/performed       Past Medical History:  Diagnosis Date  . Allergy   . Depression    postpartum  . 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 Raynaud's disease   . Urinary incontinence     Past Surgical History:  Procedure Laterality Date  . ADENOIDECTOMY    . TONSILLECTOMY    . TYMPANOSTOMY    . WISDOM TOOTH EXTRACTION      There were no vitals filed for this visit.    Pelvic Floor Physical Therapy Treatment Note and Re-eval  SCREENING  Changes in medications, allergies, or medical history?: Started taking melatonin.     SUBJECTIVE  Patient reports: Has not had any pain over the last week but has had at least 7 instances of leakage with the allergy season kicking in full force. Got her fit-splint last night and likes it so far.   Pain update:  No pain!  Patient Goals: Decrease SUI, LBP, and constipation.  OBJECTIVE  Changes in: Posture/Observations:  Decreased anterior pelvic tilt when wearing fit-splint.  Range of Motion/Flexibilty:  Some tenderness still present with mobilization of the spine and sacrum but it resolved much faster and with less pressure required.    Palpation: TTP through L>R in adductors, quads and iliacus with  ticklishness and pain response simultaneously pre-treatment.   INTERVENTIONS THIS SESSION: Manual: performed grade 3-4 mobs to thoracic spine and sacrum to continue to improve mobility and decrease restriction of fascia and allow for improved posture and recruitment of the PFM and TA. Performed TP release and hold-relax lengthening of the adductors, proximal quadriceps, and iliacus B to decrease tension pulling her into anterior pelvic tilt and creating referred tension into the PFM. Theract: educated patient on how to perform hamstring stretch and how to appropriately don her fit-splint for improved proprioception and support.  Total time: 70 min                          PT Education - 11/25/17 1854    Education provided  Yes    Education Details  See Interventions this session    Person(s) Educated  Patient    Methods  Explanation    Comprehension  Verbalized understanding       PT Short Term Goals - 11/26/17 0954      PT SHORT TERM GOAL #1   Title  Patient will demonstrate a coordinated contraction, relaxation, and bulge of the pelvic floor muscles to demonstrate functional recruitment and motion and allow for further strengthening.    Time  4    Period  Weeks  Status  Achieved    Target Date  11/03/17      PT SHORT TERM GOAL #2   Title  Patient will demonstrate functional recruitment of TA with breathing, sit-to-stand, squatting/lifting, and walking to allow for improved pelvic brace coordination, improved balance, and decreased downward pressure on the pelvic organs,.    Time  4    Period  Weeks    Status  Achieved    Target Date  11/03/17      PT SHORT TERM GOAL #3   Title  Patient will report consistent use of foot-stool (squatty-potty) for positioning with BM to decrease pain with BM and intra-abdominal pressure.    Time  4    Period  Weeks    Status  Achieved    Target Date  11/03/17        PT Long Term Goals - 11/26/17 0953      PT  LONG TERM GOAL #1   Title  Patient will report no episodes of SUI over the course of the prior two weeks to demonstrate improved functional ability.    Time  8    Period  Weeks    Status  On-going    Target Date  11/30/17      PT LONG TERM GOAL #2   Title  Patient will report no pain with intercourse to demonstrate improved functional ability.    Time  8    Period  Weeks    Status  On-going    Target Date  11/30/17      PT LONG TERM GOAL #3   Title  Patient will report having BM's at least every-other day with consistency between [facility] stool scale 3-5 over the prior week to demonstrate decreased constipation.    Time  8    Period  Weeks    Status  On-going    Target Date  11/30/17      PT LONG TERM GOAL #4   Title  Patient will score at or below 23 on the PFDI and 10 on the PFIQ to demonstrate a clinically meaningful decrease in disability and distress due to pelvic floor dysfunction.    Baseline  PFDI: 68/300, PFIQ: 20/300    Time  8    Period  Weeks    Status  Unable to assess    Target Date  11/30/17            Plan - 11/26/17 0956    Clinical Impression Statement  Pt. has made great improvement in her awareness of posture and ability to recruit her PFM as well as her TA with functional activity, she is progressing toward her goals and has had no pain over the last week (down from 7/10 at worst) and only has had leakage when coughing or sneezing exceeds 4-5 seconds in a row due to it surpassing the endurance of the PFM at this time. Patient demonstrated decreased tenderness and stiffness today with manual treatment and is expected to continue to improve in her PFM strength and posture with further pelvic PT. We will continue for 6 more weeks at once per week to achieve goals and prevent symptom recurrence.     Clinical Presentation  Stable    Clinical Decision Making  Low    Rehab Potential  Excellent    Clinical Impairments Affecting Rehab Potential  Had separated pubic  symphysis for 7 months following delivery of son, requested pelvic PT, motivated, knowledgeable.     PT Frequency  1x / week    PT Duration  6 weeks    PT Treatment/Interventions  ADLs/Self Care Home Management;Biofeedback;Electrical Stimulation;Stair training;Functional mobility training;Neuromuscular re-education;Balance training;Therapeutic exercise;Therapeutic activities;Patient/family education;Manual techniques;Dry needling;Taping    PT Next Visit Plan  strengthening of TA and glutes as well as scapulae    PT Home Exercise Plan  Diaphragmatic breathing and TA activation, kegels, posture, Tall kneeling, multi-directional stepping, squatty-potty, ILY massage and bowel retraining. Mini-marches, A/P shifts, fit-splint    Consulted and Agree with Plan of Care  Patient       Patient will benefit from skilled therapeutic intervention in order to improve the following deficits and impairments:  Increased fascial restricitons, Improper body mechanics, Pain, Decreased coordination, Increased muscle spasms, Postural dysfunction, Decreased activity tolerance, Decreased strength, Decreased range of motion, Decreased balance  Visit Diagnosis: Myalgia  Other lack of coordination  Muscle weakness (generalized)  Stress incontinence of urine  Constipation, unspecified constipation type     Problem List Patient Active Problem List   Diagnosis Date Noted  . Community acquired pneumonia 09/23/2017  . Raynaud's disease   . Urinary incontinence    Cleophus Molt DPT, ATC Cleophus Molt 11/26/2017, 1:50 PM   [facility] MAIN [facility] SERVICES [address] [place], [place], [address] Phone: [phone]   Fax:  [phone]  Name: BRAELEIGH PYPER MRN: [id number] Date of Birth: 10/11/1985

## 2017-12-02 ENCOUNTER — Ambulatory Visit: Payer: BC Managed Care – PPO

## 2017-12-02 DIAGNOSIS — M6281 Muscle weakness (generalized): Secondary | ICD-10-CM

## 2017-12-02 DIAGNOSIS — K5900 Constipation, unspecified: Secondary | ICD-10-CM

## 2017-12-02 DIAGNOSIS — M791 Myalgia, unspecified site: Secondary | ICD-10-CM | POA: Diagnosis not present

## 2017-12-02 DIAGNOSIS — N393 Stress incontinence (female) (male): Secondary | ICD-10-CM

## 2017-12-02 DIAGNOSIS — R278 Other lack of coordination: Secondary | ICD-10-CM

## 2017-12-02 NOTE — Therapy (Signed)
[place] [facility] MAIN [facility] [address] [place], [place], [address] Phone: [phone]   Fax:  [phone]  Physical Therapy Treatment  Patient Details  Name: Barbara Perez MRN: [id number] Date of Birth: 10/20/1985 Referring Provider: Erasmo DownerBacigalupo, Angela M   Encounter Date: 12/02/2017  PT End of Session - 12/03/17 2133    Visit Number  9    Number of Visits  14    Date for PT Re-Evaluation  01/13/18    Authorization Type  BCBS    PT Start Time  1602    PT Stop Time  1700    PT Time Calculation (min)  58 min    Activity Tolerance  Patient tolerated treatment well    Behavior During Therapy  [facility]       Past Medical History:  Diagnosis Date  . Allergy   . Depression    postpartum  . 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 Raynaud's disease   . Urinary incontinence     Past Surgical History:  Procedure Laterality Date  . ADENOIDECTOMY    . TONSILLECTOMY    . TYMPANOSTOMY    . WISDOM TOOTH EXTRACTION      There were no vitals filed for this visit.    Pelvic Floor Physical Therapy Treatment Note  SCREENING  Changes in medications, allergies, or medical history?: no     SUBJECTIVE  Patient reports: She was wearing  The fitsplint every day but only tried wearing her fit-splint at work one morning and happened ended up having horrible diarrhea with blood in the stool the same day and does not think it is associated but has not had a BM or worn the splint since.   Pain update:  Location of pain: low back Current pain:  0/10  Max pain:  2/10 Least pain:  0/10 Nature of pain: achy  Patient Goals: Decrease SUI, LBP, and constipation.     OBJECTIVE   INTERVENTIONS THIS SESSION: NM re-ed: assessed and educated on running mechanics to minimize GRF and pressure on pelvic organs while maximizing PFM and TA recruitment. Educated on and  practiced form for squat/lifting, mini-marches, and kneeling squats to improve TA strength and graded recruitment/enggement through different ranges of motion to improve their ability to functionally engage along with the PFM and to prevent SUI.  Therex: Educated on and practiced scapular retractions, chin-tucks, and seated rows to improve postural strength and help allow for Pt. To achieve proper pelvic tilt for best PFM recruitment.  Total time: 58 min.                          PT Education - 12/03/17 2130    Education provided  Yes    Education Details  See Pt. Instructions and Interventions this session    Person(s) Educated  Patient    Methods  Explanation;Demonstration;Verbal cues    Comprehension  Verbalized understanding       PT Short Term Goals - 11/26/17 0954      PT SHORT TERM GOAL #1   Title  Patient will demonstrate a coordinated contraction, relaxation, and bulge of the pelvic floor muscles to demonstrate functional recruitment and motion and allow for further strengthening.    Time  4    Period  Weeks    Status  Achieved    Target Date  11/03/17  PT SHORT TERM GOAL #2   Title  Patient will demonstrate functional recruitment of TA with breathing, sit-to-stand, squatting/lifting, and walking to allow for improved pelvic brace coordination, improved balance, and decreased downward pressure on the pelvic organs,.    Time  4    Period  Weeks    Status  Achieved    Target Date  11/03/17      PT SHORT TERM GOAL #3   Title  Patient will report consistent use of foot-stool (squatty-potty) for positioning with BM to decrease pain with BM and intra-abdominal pressure.    Time  4    Period  Weeks    Status  Achieved    Target Date  11/03/17        PT Long Term Goals - 11/26/17 0953      PT LONG TERM GOAL #1   Title  Patient will report no episodes of SUI over the course of the prior two weeks to demonstrate improved functional ability.     Time  8    Period  Weeks    Status  On-going    Target Date  11/30/17      PT LONG TERM GOAL #2   Title  Patient will report no pain with intercourse to demonstrate improved functional ability.    Time  8    Period  Weeks    Status  On-going    Target Date  11/30/17      PT LONG TERM GOAL #3   Title  Patient will report having BM's at least every-other day with consistency between [facility] stool scale 3-5 over the prior week to demonstrate decreased constipation.    Time  8    Period  Weeks    Status  On-going    Target Date  11/30/17      PT LONG TERM GOAL #4   Title  Patient will score at or below 23 on the PFDI and 10 on the PFIQ to demonstrate a clinically meaningful decrease in disability and distress due to pelvic floor dysfunction.    Baseline  PFDI: 68/300, PFIQ: 20/300    Time  8    Period  Weeks    Status  Unable to assess    Target Date  11/30/17            Plan - 12/03/17 2137    Clinical Impression Statement  Pt. responded well to all treatment today and demonstrated understanding and ability to perform exercises appropriately. Continue per POC.    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     PT Frequency  1x / week    PT Duration  6 weeks    PT Treatment/Interventions  ADLs/Self Care Home Management;Biofeedback;Electrical Stimulation;Stair training;Functional mobility training;Neuromuscular re-education;Balance training;Therapeutic exercise;Therapeutic activities;Patient/family education;Manual techniques;Dry needling;Taping    PT Next Visit Plan  manual to low back and hip flexors. teach side-plank, bow-and-arrow, side-stretch    PT Home Exercise Plan  Diaphragmatic breathing and TA activation, kegels, posture, Tall kneeling, multi-directional  stepping, squatty-potty, ILY massage and bowel retraining. Mini-marches, A/P shifts, fit-splint    Consulted and Agree with Plan of Care  Patient       Patient will benefit from skilled therapeutic intervention in order to improve the following deficits and  impairments:  Increased fascial restricitons, Improper body mechanics, Pain, Decreased coordination, Increased muscle spasms, Postural dysfunction, Decreased activity tolerance, Decreased strength, Decreased range of motion, Decreased balance  Visit Diagnosis: Myalgia  Other lack of coordination  Muscle weakness (generalized)  Stress incontinence of urine  Constipation, unspecified constipation type     Problem List Patient Active Problem List   Diagnosis Date Noted  . Community acquired pneumonia 09/23/2017  . Raynaud's disease   . Urinary incontinence    Cleophus Molt DPT, ATC Cleophus Molt 12/03/2017, 9:49 PM  [place] [facility] MAIN [facility] SERVICES [address] [place], [place], [address] Phone: [phone]   Fax:  [phone]  Name: Barbara Perez MRN: [id number] Date of Birth: [birth year]

## 2017-12-02 NOTE — Patient Instructions (Addendum)
Mini-Marches    Exhale, drawing the lower tummy (TA) in toward the back bone and hold contraction while you lift one foot ~ 2 inches off the mat, then the other foot before relaxing and resetting. Try to keep your hips from rocking, using your hands to sense whether they are staying even as pictured.      Perform _10__ repetitions for _2-3__ sets. Do this _1_ times per day.       Inhale as you lower down gradually releasing the glutes and the lower tummy muscles. As you exhale, gradually increase the tension in the glutes and lower tummy until you reach the top and give an "extra squeeze" at the top. Repeat 10x2__, __1_ Times per day.     Shoulder Retraction and Downward Rotation   Rotate the shoulder blades back and down as if you had to hold a pencil between them, holding for 1 full second each time. Repeat this __ times __ times per day.      Start with Shoulder Retraction and Downward Rotation pictures above, holding the position while pulling the chin straight back as if trying to make a "double chin".  Breathe in forward and breathe out as you pull back, repeating this ___ times ____ times per day.    Make sure that your pelvis is in a neutral position as you start to pull the band back, starting the motion with the shoulder blades and squeezing all the way back, letting the hands rotate as needed. Exhale back, inhale forward, using the lower tummy muscles each time you exhale.   * When running, slightly lean forward to engage the lower tummy muscles and pelvic floor. Then aim to land on the mid-foot rather than the heel when striking. Keep your cadence faster than feels normal.

## 2017-12-09 ENCOUNTER — Ambulatory Visit: Payer: BC Managed Care – PPO

## 2017-12-09 DIAGNOSIS — M791 Myalgia, unspecified site: Secondary | ICD-10-CM

## 2017-12-09 DIAGNOSIS — K5900 Constipation, unspecified: Secondary | ICD-10-CM

## 2017-12-09 DIAGNOSIS — N393 Stress incontinence (female) (male): Secondary | ICD-10-CM

## 2017-12-09 DIAGNOSIS — M6281 Muscle weakness (generalized): Secondary | ICD-10-CM

## 2017-12-09 DIAGNOSIS — R278 Other lack of coordination: Secondary | ICD-10-CM

## 2017-12-09 NOTE — Therapy (Signed)
[place] [facility] MAIN [facility] SERVICES [address] [place], [place], [address] Phone: [phone]   Fax:  [phone]  Physical Therapy Treatment  Patient Details  Name: Barbara Perez MRN: [id number] Date of Birth: March 04, 1986 Referring Provider: Erasmo Downer   Encounter Date: 12/09/2017  PT End of Session - 12/10/17 0902    Visit Number  10    Number of Visits  14    Date for PT Re-Evaluation  01/13/18    Authorization Type  BCBS    PT Start Time  1610    PT Stop Time  1715    PT Time Calculation (min)  65 min    Activity Tolerance  Patient tolerated treatment well    Behavior During Therapy  [facility] for tasks assessed/performed       Past Medical History:  Diagnosis Date  . Allergy   . Depression    postpartum  . 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 Raynaud's disease   . Urinary incontinence     Past Surgical History:  Procedure Laterality Date  . ADENOIDECTOMY    . TONSILLECTOMY    . TYMPANOSTOMY    . WISDOM TOOTH EXTRACTION      There were no vitals filed for this visit.    Pelvic Floor Physical Therapy Treatment Note  SCREENING  Changes in medications, allergies, or medical history?: no   SUBJECTIVE  Patient reports: Had another IBS episode yesterday. Has started running, had better form second day and felt that she was using what we talked about and only had a tiny bit of leakage when running.    Pain update: No pain.  Patient Goals: Decrease SUI, LBP, and constipation.    OBJECTIVE  Changes in: Posture/Observations:  Pt. Has improved awareness of posture but continues to have difficulty recruiting TA to full amount, much improved following TP release today.  Abdominal:  Doming with coughing in hook-lying even with focus on TA contraction. ihibition of TA, especially with breath hold/glottis release for cough. Pt. Able to reduce doming by ~ 50%  following treatment today and is not consistent but does achieve appropriate response with at least 10% of attempts.  Palpation: Multiple trigger points in the R QL and lumbar extensors referring down into the SIJ. Lesser TP's through B obliques.     INTERVENTIONS THIS SESSION: NM re-ed: educated on slight posterior pelvic tilt with pelvic brace activation to protect during coughing or sneezing. Pt. Required ample verbal and tactile cueing to appropriately recruit the TA and maintain contraction with cough. We used progression of speed and from "huff" with open glottis to "cough" with light glottis closure to help re-educate the response which currently looks like complete inhibition of the TA and Oblique dominance with cough.  Manual: performed TP release to L obliques and R obliques, QL, ad lumbar extensors to disinhibit the TA and allow for improved recruitment of the TA/pelvic brace to decrease leakage.  Total time: 65 min.                         PT Education - 12/10/17 0901    Education provided  Yes    Education Details  See Pt. Instructions and Interventions this session    Person(s) Educated  Patient    Methods  Explanation;Demonstration;Tactile cues;Verbal cues;Handout    Comprehension  Verbalized understanding;Returned demonstration;Tactile cues required;Verbal cues required;Need further  instruction       PT Short Term Goals - 11/26/17 0954      PT SHORT TERM GOAL #1   Title  Patient will demonstrate a coordinated contraction, relaxation, and bulge of the pelvic floor muscles to demonstrate functional recruitment and motion and allow for further strengthening.    Time  4    Period  Weeks    Status  Achieved    Target Date  11/03/17      PT SHORT TERM GOAL #2   Title  Patient will demonstrate functional recruitment of TA with breathing, sit-to-stand, squatting/lifting, and walking to allow for improved pelvic brace coordination, improved balance, and  decreased downward pressure on the pelvic organs,.    Time  4    Period  Weeks    Status  Achieved    Target Date  11/03/17      PT SHORT TERM GOAL #3   Title  Patient will report consistent use of foot-stool (squatty-potty) for positioning with BM to decrease pain with BM and intra-abdominal pressure.    Time  4    Period  Weeks    Status  Achieved    Target Date  11/03/17        PT Long Term Goals - 11/26/17 0953      PT LONG TERM GOAL #1   Title  Patient will report no episodes of SUI over the course of the prior two weeks to demonstrate improved functional ability.    Time  8    Period  Weeks    Status  On-going    Target Date  11/30/17      PT LONG TERM GOAL #2   Title  Patient will report no pain with intercourse to demonstrate improved functional ability.    Time  8    Period  Weeks    Status  On-going    Target Date  11/30/17      PT LONG TERM GOAL #3   Title  Patient will report having BM's at least every-other day with consistency between [facility] stool scale 3-5 over the prior week to demonstrate decreased constipation.    Time  8    Period  Weeks    Status  On-going    Target Date  11/30/17      PT LONG TERM GOAL #4   Title  Patient will score at or below 23 on the PFDI and 10 on the PFIQ to demonstrate a clinically meaningful decrease in disability and distress due to pelvic floor dysfunction.    Baseline  PFDI: 68/300, PFIQ: 20/300    Time  8    Period  Weeks    Status  Unable to assess    Target Date  11/30/17            Plan - 12/10/17 0902    Clinical Impression Statement  Pt. responded well to manual treatment today, demonstrating improved TA recruitment and functional recruitment for posture following treatment. She responded well to NM re-ed as well but will require further practice to improve consistency of reaction with cough for decreased leakage. Continue per POC.    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     PT Frequency  1x / week  PT Duration  6 weeks    PT Treatment/Interventions  ADLs/Self Care Home Management;Biofeedback;Electrical Stimulation;Stair training;Functional mobility training;Neuromuscular re-education;Balance training;Therapeutic exercise;Therapeutic activities;Patient/family education;Manual techniques;Dry needling;Taping    PT Next Visit Plan  review cough with pelvic brace, manual to hip flexors. teach side-plank, bow-and-arrow, side-stretch    PT Home Exercise Plan  Diaphragmatic breathing and TA activation, kegels, posture, Tall kneeling, multi-directional stepping, squatty-potty, ILY massage and bowel retraining. Mini-marches, A/P shifts, fit-splint, cough with pelvic brace    Consulted and Agree with Plan of Care  Patient       Patient will benefit from skilled therapeutic intervention in order to improve the following deficits and impairments:  Increased fascial restricitons, Improper body mechanics, Pain, Decreased coordination, Increased muscle spasms, Postural dysfunction, Decreased activity tolerance, Decreased strength, Decreased range of motion, Decreased balance  Visit Diagnosis: Myalgia  Other lack of coordination  Muscle weakness (generalized)  Stress incontinence of urine  Constipation, unspecified constipation type     Problem List Patient Active Problem List   Diagnosis Date Noted  . Community acquired pneumonia 09/23/2017  . Raynaud's disease   . Urinary incontinence    Cleophus MoltKeeli T. Averee Harb DPT, ATC Cleophus MoltKeeli T Teige Rountree 12/10/2017, 9:05 AM  [place] [facility] [address] [place], [place], [address] Phone: [phone]   Fax:  [phone]  Name: Barbara Perez MRN: [id number] Date of Birth:  08/27/1985

## 2017-12-09 NOTE — Patient Instructions (Signed)
Mini-Marches    Exhale, drawing the lower tummy (TA) in toward the back bone and hold contraction while you lift one foot ~ 2 inches off the mat, then the other foot before relaxing and resetting. Try to keep your hips from rocking, using your hands to sense whether they are staying even as pictured.      Perform ___ repetitions for ___ sets. Do this __ times per day.   2) Practice "working up to" a cough in the same position as above, performing a slight pelvic tuck and drawing in the lower belly as you go from a light "huff" noise to more of a cough by gently closing the glottis and then releasing it as you press the air through. Work up as you can control the motion to bigger and bigger "coughs" by holding in the pressure in and then releasing.

## 2018-01-15 ENCOUNTER — Ambulatory Visit: Payer: BC Managed Care – PPO | Attending: Family Medicine

## 2018-01-15 DIAGNOSIS — M6281 Muscle weakness (generalized): Secondary | ICD-10-CM | POA: Insufficient documentation

## 2018-01-15 DIAGNOSIS — M791 Myalgia, unspecified site: Secondary | ICD-10-CM

## 2018-01-15 DIAGNOSIS — R278 Other lack of coordination: Secondary | ICD-10-CM | POA: Diagnosis present

## 2018-01-15 DIAGNOSIS — K5900 Constipation, unspecified: Secondary | ICD-10-CM

## 2018-01-15 DIAGNOSIS — N393 Stress incontinence (female) (male): Secondary | ICD-10-CM | POA: Insufficient documentation

## 2018-01-15 NOTE — Patient Instructions (Addendum)
Bowel retraining program: 1) Start by drinking a hot (optionally caffeinated) beverage 2) Do your "I love you" colonic massage  3) Go for a short walk 4) Go to the toilet and sit with feet up on squatty potty and relaxing forward on your knees with tall spine. Take deep, lengthening, breaths and allow up to 10 minutes to have a BM without "straining" before you move on with the day.     Self Internal Trigger Point Relief    1) Wash your hands and prop yourself up in a way where you can easily reach the vagina. You may wish to have a small hand-held mirror near by.  2) lubricate the tool and vaginal opening using a hypoallergenic lubricant such as "slippery-stuff".   3) Slowly and gently insert the tool into the vagina using deep breaths to allow relaxation of the muscles around the tool.  4) Avoiding the "12 o-clock" region near the urethra, gently use the handle of the tool like a lever to press the angled tip of the tool onto the wall of the pelvic floor.   5) Move the tool to different areas of the pelvic floor and feel for areas that are tender called "trigger points". When you find one hold the tool still, applying just enough pressure to elicit mild discomfort and take deep belly breaths until the discomfort subsides or decreases by at least 50%.   6) Repeat the process for any trigger points you find spending between 3-10 minutes on this per night until you do not find any more trigger points or you are told otherwise by your therapist..    TP release tool Female version: Dr. Alonna Minium Premium Prostate Massager

## 2018-01-15 NOTE — Therapy (Signed)
[place] [facility] [address] [place], [place], [address] Phone: [phone]   Fax:  [phone]  Physical Therapy Treatment and Discharge Summary  Patient Details  Name: Barbara Perez MRN: [id number] Date of Birth: 01-26-1986 Referring Provider: Virginia Crews   Encounter Date: 01/15/2018  PT End of Session - 01/15/18 1937    Visit Number  11    Number of Visits  14    Date for PT Re-Evaluation  01/13/18    Authorization Type  BC[employer]    PT Start Time  1633    PT Stop Time  1733    PT Time Calculation (min)  60 min    Activity Tolerance  Patient tolerated treatment well    Behavior During Therapy  [facility] for tasks assessed/performed       Past Medical History:  Diagnosis Date  . Allergy   . Depression    postpartum  . 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 Raynaud's disease   . Urinary incontinence     Past Surgical History:  Procedure Laterality Date  . ADENOIDECTOMY    . TONSILLECTOMY    . TYMPANOSTOMY    . WISDOM TOOTH EXTRACTION      There were no vitals filed for this visit.   Pelvic Floor Physical Therapy Treatment Note and Discharge Summary  SCREENING  Changes in medications, allergies, or medical history?: no    SUBJECTIVE  Patient reports: Very little leakage when holding in bladder is very full and she sneezes really hard and with jumping. Feels that she has much better control. Decrease SUI, LBP, and constipation. Had weeks where she had "episodes" and would go days without a BM but recently has been having regular BM's but is taking colace and is able to be regular with "some straining.".    Pain update:  Location of pain:  No pain over past week.   Patient Goals: Decrease SUI, LBP, and constipation.    OBJECTIVE  Changes in: Posture/Observations:  Very minor l lumbar , R thoracic curvature. Able to activate TA appropriately but  not using with gait when distracted.    INTERVENTIONS THIS SESSION: Self-care: educated on bowel retraining program and self internal TP release to facilitate continued decrease in constipation. Educated on and trailed a tens unit for improved transit to decrease constipation by improving motility.  Therex: educated on and practiced bow-and-arrow rotations to improve thoracic mobility and decrease pressure on the nerves exiting the thoracic spine that innervate the colon and pelvic floor.    Total time: 60 min.                           PT Education - 01/15/18 1936    Education provided  Yes    Education Details  See Pt. Instructions and Interventions this session.    Person(s) Educated  Patient    Methods  Explanation;Demonstration;Tactile cues;Handout;Verbal cues    Comprehension  Verbalized understanding;Returned demonstration;Verbal cues required       PT Short Term Goals - 11/26/17 0954      PT SHORT TERM GOAL #1   Title  Patient will demonstrate a coordinated contraction, relaxation, and bulge of the pelvic floor muscles to demonstrate functional recruitment and motion and allow for further strengthening.    Time  4    Period  Weeks    Status  Achieved    Target Date  11/03/17      PT SHORT TERM GOAL #2   Title  Patient will demonstrate functional recruitment of TA with breathing, sit-to-stand, squatting/lifting, and walking to allow for improved pelvic brace coordination, improved balance, and decreased downward pressure on the pelvic organs,.    Time  4    Period  Weeks    Status  Achieved    Target Date  11/03/17      PT SHORT TERM GOAL #3   Title  Patient will report consistent use of foot-stool (squatty-potty) for positioning with BM to decrease pain with BM and intra-abdominal pressure.    Time  4    Period  Weeks    Status  Achieved    Target Date  11/03/17        PT Long Term Goals - 01/15/18 1643      PT LONG TERM GOAL #1   Title   Patient will report no episodes of SUI over the course of the prior two weeks to demonstrate improved functional ability.    Baseline  less frequent and less urine loss but still occasional leakage when bladder very full.    Status  Partially Met      PT LONG TERM GOAL #2   Title  Patient will report no pain with intercourse to demonstrate improved functional ability.    Time  8    Period  Weeks    Status  Achieved    Target Date  01/13/18      PT LONG TERM GOAL #3   Title  Patient will report having BM's at least every-other day with consistency between [facility] stool scale 3-5 over the prior week to demonstrate decreased constipation.    Baseline  Having regular BM's but is using Colace and BM's are still 1-3 rather than 3-5    Time  8    Period  Weeks    Status  Partially Met    Target Date  01/13/18      PT LONG TERM GOAL #4   Title  Patient will score at or below 23 on the PFDI and 10 on the PFIQ to demonstrate a clinically meaningful decrease in disability and distress due to pelvic floor dysfunction.    Baseline  PFDI: 68/300, PFIQ: 20/300, as of 5/23: PFDI: 56/300, PFIQ:42.5/300 (Pt. believes she has made improvement, states she was probably not as honest the first time.)    Time  8    Period  Weeks    Status  Not Met    Target Date  01/13/18            Plan - 01/15/18 1938    Clinical Impression Statement  Pt. has met or made progress toward almost all goals but continues to have some occasional leakage and constipation, pain has not returned. She admits that she has not been consistent with reccomendations and that she knows what to do but does not do it like she should. Education today aimed at making sure she had remaining education peices necessary to continue to improve upon D/C and re-assessing. We will D/C at this time with patient aware that she can contact PT for advice with new exercises and activities as needed.     Clinical Presentation  Stable    Clinical  Decision Making  Low    Rehab Potential  Excellent    Clinical Impairments Affecting Rehab Potential  Had separated pubic symphysis for 7 months  following delivery of son, requested pelvic PT, motivated, knowledgeable.     PT Frequency  1x / week    PT Duration  6 weeks    PT Treatment/Interventions  ADLs/Self Care Home Management;Biofeedback;Electrical Stimulation;Stair training;Functional mobility training;Neuromuscular re-education;Balance training;Therapeutic exercise;Therapeutic activities;Patient/family education;Manual techniques;Dry needling;Taping    PT Next Visit Plan  D/C    PT Home Exercise Plan  Diaphragmatic breathing and TA activation, kegels, posture, Tall kneeling, multi-directional stepping, squatty-potty, ILY massage and bowel retraining. Mini-marches, A/P shifts, fit-splint, cough with pelvic brace    Consulted and Agree with Plan of Care  Patient       Patient will benefit from skilled therapeutic intervention in order to improve the following deficits and impairments:  Increased fascial restricitons, Improper body mechanics, Pain, Decreased coordination, Increased muscle spasms, Postural dysfunction, Decreased activity tolerance, Decreased strength, Decreased range of motion, Decreased balance  Visit Diagnosis: Myalgia  Other lack of coordination  Muscle weakness (generalized)  Stress incontinence of urine  Constipation, unspecified constipation type     Problem List Patient Active Problem List   Diagnosis Date Noted  . Community acquired pneumonia 09/23/2017  . Raynaud's disease   . Urinary incontinence    Willa Rough DPT, ATC Willa Rough 01/15/2018, 7:42 PM  [place] [facility] [address] [place], [place], [address] Phone: [phone]   Fax:  [phone]  Name: Barbara Perez MRN: [id number] Date of Birth: Mar 03, 1986

## 2018-03-04 ENCOUNTER — Encounter: Payer: Self-pay | Admitting: Family Medicine

## 2018-03-04 ENCOUNTER — Ambulatory Visit (INDEPENDENT_AMBULATORY_CARE_PROVIDER_SITE_OTHER): Payer: BC Managed Care – PPO | Admitting: Family Medicine

## 2018-03-04 VITALS — BP 104/66 | HR 75 | Resp 16 | Ht 62.0 in | Wt 153.0 lb

## 2018-03-04 DIAGNOSIS — F4329 Adjustment disorder with other symptoms: Secondary | ICD-10-CM | POA: Diagnosis not present

## 2018-03-04 DIAGNOSIS — E663 Overweight: Secondary | ICD-10-CM | POA: Diagnosis not present

## 2018-03-04 DIAGNOSIS — Z8349 Family history of other endocrine, nutritional and metabolic diseases: Secondary | ICD-10-CM

## 2018-03-04 DIAGNOSIS — Z0000 Encounter for general adult medical examination without abnormal findings: Secondary | ICD-10-CM

## 2018-03-04 DIAGNOSIS — Z0001 Encounter for general adult medical examination with abnormal findings: Secondary | ICD-10-CM | POA: Diagnosis not present

## 2018-03-04 DIAGNOSIS — Z833 Family history of diabetes mellitus: Secondary | ICD-10-CM

## 2018-03-04 DIAGNOSIS — R5382 Chronic fatigue, unspecified: Secondary | ICD-10-CM

## 2018-03-04 MED ORDER — SERTRALINE HCL 50 MG PO TABS: 50.0000 mg | ORAL_TABLET | Freq: Every day | ORAL | 3 refills | Status: AC

## 2018-03-04 NOTE — Progress Notes (Signed)
Patient: Barbara Perez, Female    DOB: 07/18/1986, 32 y.o.   MRN: [id number] Visit Date: 03/04/2018  Today's Provider: Shirlee LatchAngela Mckenzy Salazar, MD   I, Joslyn HyEmily Ratchford, CMA, am acting as scribe for Shirlee LatchAngela Paraskevi Funez, MD.  Chief Complaint  Patient presents with  . Annual Exam   Subjective:    Annual physical exam Barbara Perez is a 32 y.o. female who presents today for health maintenance and complete physical. She feels fairly well. She reports exercising 0-3 times per week, depending on how busy her week is; exercises for about 1 hour. She reports she is sleeping fairly well.  Last pap- 12/05/2016; no H/O abnormal paps ---------------------------------------------------------------- Pt is requesting a TSH to check for hypothyroidism. Her MGM had hypothyroidism, and pt is symptomatic. She has constipation, fatigue, difficulty sleeping and weight gain.    She is struggling with self-image since having her child and struggling with weight loss.  She suffered with post-partum depression and took Zoloft for ~5628m.    Review of Systems  Constitutional: Positive for fatigue. Negative for activity change, appetite change, chills, diaphoresis, fever and unexpected weight change.  HENT: Positive for sinus pressure. Negative for congestion, dental problem, drooling, ear discharge, ear pain, facial swelling, hearing loss, mouth sores, nosebleeds, postnasal drip, rhinorrhea, sinus pain, sneezing, sore throat, tinnitus, trouble swallowing and voice change.   Eyes: Negative.   Respiratory: Negative.   Cardiovascular: Negative.   Gastrointestinal: Positive for abdominal distention, blood in stool, constipation, diarrhea and nausea. Negative for abdominal pain, anal bleeding, rectal pain and vomiting.  Endocrine: Positive for cold intolerance, polydipsia, polyphagia and polyuria. Negative for heat intolerance.  Genitourinary: Negative.   Musculoskeletal: Negative.   Skin: Negative.     Allergic/Immunologic: Positive for environmental allergies. Negative for food allergies and immunocompromised state.  Neurological: Positive for headaches. Negative for dizziness, tremors, seizures, syncope, facial asymmetry, speech difficulty, weakness, light-headedness and numbness.  Hematological: Negative.   Psychiatric/Behavioral: Positive for agitation and sleep disturbance. Negative for behavioral problems, confusion, decreased concentration, dysphoric mood, hallucinations, self-injury and suicidal ideas. The patient is not nervous/anxious and is not hyperactive.     Social History      She  reports that she has never smoked. She has never used smokeless tobacco. She reports that she drinks alcohol. She reports that she does not use drugs.       Social History   Socioeconomic History  . Marital status: Married    Spouse name: OrthoptistDylan  . Number of children: 1  . Years of education: 2716  . Highest education level: Bachelor's degree (e.g., BA, AB, BS)  Occupational History  . Occupation: Teacher, adult educationN    Employer: [employer]  Social Needs  . Financial resource strain: Not very hard  . Food insecurity:    Worry: Never true    Inability: Never true  . Transportation needs:    Medical: No    Non-medical: No  Tobacco Use  . Smoking status: Never Smoker  . Smokeless tobacco: Never Used  Substance and Sexual Activity  . Alcohol use: Yes    Frequency: Never    Comment: rare  . Drug use: No  . Sexual activity: Yes    Partners: Male    Birth control/protection: IUD  Lifestyle  . Physical activity:    Days per week: 1 day    Minutes per session: 60 min  . Stress: Not on file  Relationships  . Social connections:  Talks on phone: Not on file    Gets together: Not on file    Attends religious service: Not on file    Active member of club or organization: Not on file    Attends meetings of clubs or organizations: Not on file    Relationship status: Not on file   Other Topics Concern  . Not on file  Social History Narrative  . Not on file    Past Medical History:  Diagnosis Date  . Allergy   . Depression    postpartum  . 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 Raynaud's disease   . Urinary incontinence      Patient Active Problem List   Diagnosis Date Noted  . Raynaud's disease   . Urinary incontinence   . Mixed incontinence 12/22/2015  . Mixed emotional features as adjustment reaction 12/05/2015  . Irritable bowel syndrome with diarrhea 03/31/2015  . Scoliosis 03/31/2015    Past Surgical History:  Procedure Laterality Date  . ADENOIDECTOMY    . TONSILLECTOMY    . TYMPANOSTOMY    . WISDOM TOOTH EXTRACTION      Family History        Family Status  Relation Name Status  . Mother  Deceased  . Father  Alive  . Brother Half Brother Alive  . MGM  Alive  . MGF  Alive  . PGM  Alive  . PGF  Alive  . Brother Half Brother Alive  . Neg Hx  (Not Specified)        Her family history includes Alcohol abuse in her father; Anemia in her brother; Anxiety disorder in her brother; Brain cancer in her mother; Cataracts in her maternal grandfather; Cerebral aneurysm in her mother; Depression in her brother; Diabetes in her maternal grandmother; Heart attack in her paternal grandfather; Hypertension in her maternal grandfather; Hypothyroidism in her maternal grandmother; Lung cancer in her paternal grandmother; Skin cancer in her maternal grandfather. There is no history of Breast cancer, Colon cancer, Cervical cancer, or Ovarian cancer.      Allergies  Allergen Reactions  . Measles, Mumps & Rubella Vac Anaphylaxis  . Ceclor [Cefaclor] Other (See Comments)  . Penicillins Other (See Comments)  . Sulfamethoxazole-Trimethoprim     Other reaction(s): Other (See Comments) Unknown, rxn as a child     Current Outpatient Medications:  .  acetaminophen (TYLENOL) 500 MG tablet, Take 500 mg by  mouth every 6 (six) hours as needed., Disp: , Rfl:  .  cetirizine (ZYRTEC) 10 MG tablet, Take 10 mg by mouth daily., Disp: , Rfl:  .  docusate sodium (COLACE) 100 MG capsule, Take 100 mg by mouth 2 (two) times daily., Disp: , Rfl:  .  ibuprofen (ADVIL,MOTRIN) 600 MG tablet, Take 1 tablet (600 mg total) by mouth every 8 (eight) hours as needed for fever or moderate pain., Disp: 30 tablet, Rfl: 0 .  levonorgestrel (MIRENA) 20 MCG/24HR IUD, 1 each by Intrauterine route once., Disp: , Rfl:  .  Melatonin 5 MG LOZG, Place under the tongue., Disp: , Rfl:  .  Multiple Vitamin (MULTIVITAMIN) tablet, Take 1 tablet by mouth daily., Disp: , Rfl:  .  polyethylene glycol (MIRALAX / GLYCOLAX) packet, Take 17 g by mouth daily., Disp: , Rfl:  .  Probiotic Product (PROBIOTIC ADVANCED PO), Take by mouth., Disp: , Rfl:    Patient Care Team: Erasmo Downer, MD as PCP - General (  Family Medicine)      Objective:   Vitals: BP 104/66 (BP Location: Left Arm, Patient Position: Sitting, Cuff Size: Normal)   Pulse 75   Resp 16   Ht 5\' 2"  (1.575 m)   Wt 153 lb (69.4 kg)   SpO2 99%   BMI 27.98 kg/m    Vitals:   03/04/18 1530  BP: 104/66  Pulse: 75  Resp: 16  SpO2: 99%  Weight: 153 lb (69.4 kg)  Height: 5\' 2"  (1.575 m)     Physical Exam  Constitutional: She is oriented to person, place, and time. She appears well-developed and well-nourished. No distress.  HENT:  Head: Normocephalic and atraumatic.  Right Ear: External ear normal.  Left Ear: External ear normal.  Nose: Nose normal.  Mouth/Throat: Oropharynx is clear and moist.  Eyes: Pupils are equal, round, and reactive to light. Conjunctivae and EOM are normal. No scleral icterus.  Neck: Neck supple. No thyromegaly present.  Cardiovascular: Normal rate, regular rhythm, normal heart sounds and intact distal pulses.  No murmur heard. Pulmonary/Chest: Effort normal and breath sounds normal. No respiratory distress. She has no wheezes. She has no  rales.  Abdominal: Soft. Bowel sounds are normal. She exhibits no distension. There is no tenderness. There is no rebound and no guarding.  Musculoskeletal: She exhibits no edema or deformity.  Lymphadenopathy:    She has no cervical adenopathy.  Neurological: She is alert and oriented to person, place, and time.  Skin: Skin is warm and dry. Capillary refill takes less than 2 seconds. No rash noted.  Psychiatric: Her speech is normal and behavior is normal. Her affect is labile. Cognition and memory are normal. She exhibits a depressed mood. She expresses no homicidal and no suicidal ideation. She expresses no suicidal plans and no homicidal plans.  Intermittently tearful  Vitals reviewed.    Depression Screen PHQ 2/9 Scores 09/23/2017  PHQ - 2 Score 0  PHQ- 9 Score 3    Assessment & Plan:     Routine Health Maintenance and Physical Exam  Exercise Activities and Dietary recommendations Goals    None      Immunization History  Administered Date(s) Administered  . Influenza,inj,Quad PF,6+ Mos 04/28/2017  . Tdap 08/31/2015    Health Maintenance  Topic Date Due  . INFLUENZA VACCINE  03/26/2018  . PAP SMEAR  12/06/2019  . TETANUS/TDAP  08/30/2025  . HIV Screening  Completed     Discussed health benefits of physical activity, and encouraged her to engage in regular exercise appropriate for her age and condition.    -------------------------------------------------------------------- Problem List Items Addressed This Visit      Other   Mixed emotional features as adjustment reaction    Patient with history of postpartum depression She took Zoloft for about 4 months with improvement of symptoms She feels like her symptoms at that time revolved around difficulty with breast-feeding and postpartum hormonal changes She has begun to feel a lot of fatigue, weight gain, overeating, self-consciousness She feels as though she lacks support as she does not have grandparents  support or other support with her child very often We will check for other causes of fatigue with CBC, TSH, CMP If all of these are negative, she will consider resuming Zoloft to see if treating her adjustment disorder will help with her chronic fatigue We will follow-up in 3 months and consider dose titration of Zoloft at that time if she does start it We will start with Zoloft 50 mg  daily Advised that it takes 6 to 8 weeks to reach full efficacy Discussed possible side effects including sexual dysfunction, decreased libido, GI upset and that these things often get better as you stay on the medication longer No SI/HI, contracted for safety       Other Visit Diagnoses    Encounter for annual physical exam    -  Primary   Family history of thyroid disease       Relevant Orders   TSH   Chronic fatigue       Relevant Orders   TSH   CBC   Comprehensive metabolic panel   Overweight       Relevant Orders   Hemoglobin A1c   Family history of diabetes mellitus       Relevant Orders   Hemoglobin A1c       Return in about 3 months (around 06/04/2018) for adjustment reaction f/u.   The entirety of the information documented in the History of Present Illness, Review of Systems and Physical Exam were personally obtained by me. Portions of this information were initially documented by Irving Burton Ratchford, CMA and reviewed by me for thoroughness and accuracy.    Erasmo Downer, MD, MPH [facility] 03/04/2018 4:50 PM

## 2018-03-04 NOTE — Assessment & Plan Note (Signed)
Patient with history of postpartum depression She took Zoloft for about 4 months with improvement of symptoms She feels like her symptoms at that time revolved around difficulty with breast-feeding and postpartum hormonal changes She has begun to feel a lot of fatigue, weight gain, overeating, self-consciousness She feels as though she lacks support as she does not have grandparents support or other support with her child very often We will check for other causes of fatigue with CBC, TSH, CMP If all of these are negative, she will consider resuming Zoloft to see if treating her adjustment disorder will help with her chronic fatigue We will follow-up in 3 months and consider dose titration of Zoloft at that time if she does start it We will start with Zoloft 50 mg daily Advised that it takes 6 to 8 weeks to reach full efficacy Discussed possible side effects including sexual dysfunction, decreased libido, GI upset and that these things often get better as you stay on the medication longer No SI/HI, contracted for safety

## 2018-03-04 NOTE — Patient Instructions (Signed)
Preventive Care 18-39 Years, Female Preventive care refers to lifestyle choices and visits with your health care provider that can promote health and wellness. What does preventive care include?  A yearly physical exam. This is also called an annual well check.  Dental exams once or twice a year.  Routine eye exams. Ask your health care provider how often you should have your eyes checked.  Personal lifestyle choices, including: ? Daily care of your teeth and gums. ? Regular physical activity. ? Eating a healthy diet. ? Avoiding tobacco and drug use. ? Limiting alcohol use. ? Practicing safe sex. ? Taking vitamin and mineral supplements as recommended by your health care provider. What happens during an annual well check? The services and screenings done by your health care provider during your annual well check will depend on your age, overall health, lifestyle risk factors, and family history of disease. Counseling Your health care provider may ask you questions about your:  Alcohol use.  Tobacco use.  Drug use.  Emotional well-being.  Home and relationship well-being.  Sexual activity.  Eating habits.  Work and work Statistician.  Method of birth control.  Menstrual cycle.  Pregnancy history.  Screening You may have the following tests or measurements:  Height, weight, and BMI.  Diabetes screening. This is done by checking your blood sugar (glucose) after you have not eaten for a while (fasting).  Blood pressure.  Lipid and cholesterol levels. These may be checked every 5 years starting at age 66.  Skin check.  Hepatitis C blood test.  Hepatitis B blood test.  Sexually transmitted disease (STD) testing.  BRCA-related cancer screening. This may be done if you have a family history of breast, ovarian, tubal, or peritoneal cancers.  Pelvic exam and Pap test. This may be done every 3 years starting at age 40. Starting at age 59, this may be done every 5  years if you have a Pap test in combination with an HPV test.  Discuss your test results, treatment options, and if necessary, the need for more tests with your health care provider. Vaccines Your health care provider may recommend certain vaccines, such as:  Influenza vaccine. This is recommended every year.  Tetanus, diphtheria, and acellular pertussis (Tdap, Td) vaccine. You may need a Td booster every 10 years.  Varicella vaccine. You may need this if you have not been vaccinated.  HPV vaccine. If you are 69 or younger, you may need three doses over 6 months.  Measles, mumps, and rubella (MMR) vaccine. You may need at least one dose of MMR. You may also need a second dose.  Pneumococcal 13-valent conjugate (PCV13) vaccine. You may need this if you have certain conditions and were not previously vaccinated.  Pneumococcal polysaccharide (PPSV23) vaccine. You may need one or two doses if you smoke cigarettes or if you have certain conditions.  Meningococcal vaccine. One dose is recommended if you are age 27-21 years and a first-year college student living in a residence hall, or if you have one of several medical conditions. You may also need additional booster doses.  Hepatitis A vaccine. You may need this if you have certain conditions or if you travel or work in places where you may be exposed to hepatitis A.  Hepatitis B vaccine. You may need this if you have certain conditions or if you travel or work in places where you may be exposed to hepatitis B.  Haemophilus influenzae type b (Hib) vaccine. You may need this if  you have certain risk factors.  Talk to your health care provider about which screenings and vaccines you need and how often you need them. This information is not intended to replace advice given to you by your health care provider. Make sure you discuss any questions you have with your health care provider. Document Released: 10/08/2001 Document Revised: 05/01/2016  Document Reviewed: 06/13/2015 Elsevier Interactive Patient Education  [employer].

## 2018-03-05 ENCOUNTER — Telehealth: Payer: Self-pay

## 2018-03-05 LAB — CBC
HEMOGLOBIN: 13.1 g/dL (ref 11.1–15.9)
Hematocrit: 37.9 % (ref 34.0–46.6)
MCH: 29.8 pg (ref 26.6–33.0)
MCHC: 34.6 g/dL (ref 31.5–35.7)
MCV: 86 fL (ref 79–97)
Platelets: 289 10*3/uL (ref 150–450)
RBC: 4.39 x10E6/uL (ref 3.77–5.28)
RDW: 13.6 % (ref 12.3–15.4)
WBC: 7.7 10*3/uL (ref 3.4–10.8)

## 2018-03-05 LAB — COMPREHENSIVE METABOLIC PANEL
A/G RATIO: 1.6 (ref 1.2–2.2)
ALBUMIN: 4.4 g/dL (ref 3.5–5.5)
ALT: 12 IU/L (ref 0–32)
AST: 12 IU/L (ref 0–40)
Alkaline Phosphatase: 71 IU/L (ref 39–117)
BUN / CREAT RATIO: 18 (ref 9–23)
BUN: 11 mg/dL (ref 6–20)
CO2: 25 mmol/L (ref 20–29)
Calcium: 9.5 mg/dL (ref 8.7–10.2)
Chloride: 101 mmol/L (ref 96–106)
Creatinine, Ser: 0.61 mg/dL (ref 0.57–1.00)
GFR calc non Af Amer: 122 mL/min/{1.73_m2} (ref >59)
GFR, EST AFRICAN AMERICAN: 141 mL/min/{1.73_m2} (ref >59)
Globulin, Total: 2.7 g/dL (ref 1.5–4.5)
Glucose: 83 mg/dL (ref 65–99)
POTASSIUM: 3.9 mmol/L (ref 3.5–5.2)
Sodium: 141 mmol/L (ref 134–144)
TOTAL PROTEIN: 7.1 g/dL (ref 6.0–8.5)

## 2018-03-05 LAB — TSH: TSH: 1.95 u[IU]/mL (ref 0.450–4.500)

## 2018-03-05 LAB — HEMOGLOBIN A1C
ESTIMATED AVERAGE GLUCOSE: 111 mg/dL
Hgb A1c MFr Bld: 5.5 % (ref 4.8–5.6)

## 2018-03-05 NOTE — Telephone Encounter (Signed)
-----   Message from Erasmo DownerAngela M Bacigalupo, MD sent at 03/05/2018  9:11 AM EDT ----- Normal A1c, kidney function, liver function, electrolytes, Blood counts, Thyroid function   Beryle FlockBacigalupo, Marzella SchleinAngela M, MD, MPH [facility] 03/05/2018 9:11 AM

## 2018-03-05 NOTE — Telephone Encounter (Signed)
Viewed by Marzetta BoardMegan D Westerfeld on 03/05/2018 9:16 AM

## 2018-06-04 ENCOUNTER — Ambulatory Visit: Payer: BC Managed Care – PPO | Admitting: Family Medicine

## 2018-08-11 ENCOUNTER — Ambulatory Visit: Payer: BC Managed Care – PPO | Admitting: Family Medicine

## 2018-08-11 ENCOUNTER — Encounter: Payer: Self-pay | Admitting: Family Medicine

## 2018-08-11 VITALS — BP 98/60 | HR 83 | Temp 98.0°F | Resp 16 | Wt 153.0 lb

## 2018-08-11 DIAGNOSIS — J0100 Acute maxillary sinusitis, unspecified: Secondary | ICD-10-CM

## 2018-08-11 MED ORDER — AZITHROMYCIN 250 MG PO TABS
ORAL_TABLET | ORAL | 0 refills | Status: DC
Start: 2018-08-11 — End: 2021-10-12

## 2018-08-11 NOTE — Progress Notes (Signed)
Patient: Barbara Perez Female    DOB: 02/13/1986   32 y.o.   MRN: [id number] Visit Date: 08/11/2018  Today's Provider: Dortha Kernennis Chrismon, PA   Chief Complaint  Patient presents with  . Sinusitis   Subjective:    Sinusitis  This is a new problem. The current episode started 1 to 4 weeks ago (1 month). The problem is unchanged. There has been no fever. Associated symptoms include congestion, coughing and sinus pressure. Pertinent negatives include no chills, diaphoresis, ear pain, headaches, hoarse voice, neck pain, shortness of breath, sneezing, sore throat or swollen glands. Treatments tried: Mucinex and Sudafed. The treatment provided mild relief.   Patient has had sinus congestion and cough for 1 month. Patient states cough is slightly productive. Other symptoms include sinus pressure, ear congestion, lightheadedness and chest congestion. Patient states she has very bad coughing fits were she can't stop sometimes. Patient has been taking Mucinex and Sudafed with mild relief.   Past Medical History:  Diagnosis Date  . Allergy   . Depression    postpartum  . 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 Raynaud's disease   . Urinary incontinence    Past Surgical History:  Procedure Laterality Date  . ADENOIDECTOMY    . TONSILLECTOMY    . TYMPANOSTOMY    . WISDOM TOOTH EXTRACTION     Family History  Problem Relation Age of Onset  . Brain cancer Mother   . Cerebral aneurysm Mother        "cerebral hemorrhage"  . Alcohol abuse Father   . Anemia Brother   . Diabetes Maternal Grandmother   . Hypothyroidism Maternal Grandmother   . Hypertension Maternal Grandfather   . Skin cancer Maternal Grandfather        non-melanoma  . Cataracts Maternal Grandfather   . Lung cancer Paternal Grandmother        smoker  . Heart attack Paternal Grandfather   . Anxiety disorder Brother   . Depression Brother   . Breast cancer Neg Hx   .  Colon cancer Neg Hx   . Cervical cancer Neg Hx   . Ovarian cancer Neg Hx    Allergies  Allergen Reactions  . Measles, Mumps & Rubella Vac Anaphylaxis  . Ceclor [Cefaclor] Other (See Comments)  . Penicillins Other (See Comments)  . Sulfamethoxazole-Trimethoprim     Other reaction(s): Other (See Comments) Unknown, rxn as a child    Current Outpatient Medications:  .  acetaminophen (TYLENOL) 500 MG tablet, Take 500 mg by mouth every 6 (six) hours as needed., Disp: , Rfl:  .  cetirizine (ZYRTEC) 10 MG tablet, Take 10 mg by mouth daily., Disp: , Rfl:  .  ibuprofen (ADVIL,MOTRIN) 600 MG tablet, Take 1 tablet (600 mg total) by mouth every 8 (eight) hours as needed for fever or moderate pain., Disp: 30 tablet, Rfl: 0 .  levonorgestrel (MIRENA) 20 MCG/24HR IUD, 1 each by Intrauterine route once., Disp: , Rfl:  .  Melatonin 5 MG LOZG, Place under the tongue., Disp: , Rfl:  .  Multiple Vitamin (MULTIVITAMIN) tablet, Take 1 tablet by mouth daily., Disp: , Rfl:  .  polyethylene glycol (MIRALAX / GLYCOLAX) packet, Take 17 g by mouth daily., Disp: , Rfl:  .  docusate sodium (COLACE) 100 MG capsule, Take 100 mg by mouth 2 (two) times daily., Disp: , Rfl:  .  Probiotic Product (PROBIOTIC ADVANCED PO),  Take by mouth., Disp: , Rfl:  .  sertraline (ZOLOFT) 50 MG tablet, Take 1 tablet (50 mg total) by mouth daily. (Patient not taking: Reported on 08/11/2018), Disp: 30 tablet, Rfl: 3  Review of Systems  Constitutional: Negative for chills and diaphoresis.  HENT: Positive for congestion, sinus pressure and sinus pain. Negative for ear pain, hoarse voice, sneezing and sore throat.   Respiratory: Positive for cough. Negative for shortness of breath.   Musculoskeletal: Negative for neck pain.  Neurological: Negative for headaches.   Social History   Tobacco Use  . Smoking status: Never Smoker  . Smokeless tobacco: Never Used  Substance Use Topics  . Alcohol use: Yes    Frequency: Never    Comment:  rare     Objective:   BP 98/60 (BP Location: Right Arm, Patient Position: Sitting, Cuff Size: Normal)   Pulse 83   Temp 98 F (36.7 C) (Oral)   Resp 16   Wt 153 lb (69.4 kg)   SpO2 98%   BMI 27.98 kg/m  Vitals:   08/11/18 0929  BP: 98/60  Pulse: 83  Resp: 16  Temp: 98 F (36.7 C)  TempSrc: Oral  SpO2: 98%  Weight: 153 lb (69.4 kg)   Physical Exam Constitutional:      General: She is not in acute distress.    Appearance: She is well-developed.  HENT:     Head: Normocephalic and atraumatic.     Right Ear: Hearing, tympanic membrane and ear canal normal.     Left Ear: Hearing, tympanic membrane and ear canal normal.     Nose: Nose normal.     Comments: Poor transillumination of right maxillary sinus and cloudy left maxillary sinus. Minimal discomfort - pressure.    Mouth/Throat:     Mouth: Mucous membranes are moist.     Pharynx: Oropharynx is clear.  Eyes:     General: Lids are normal. No scleral icterus.       Right eye: No discharge.        Left eye: No discharge.     Conjunctiva/sclera: Conjunctivae normal.  Cardiovascular:     Rate and Rhythm: Normal rate and regular rhythm.     Heart sounds: Normal heart sounds.  Pulmonary:     Effort: Pulmonary effort is normal. No respiratory distress.     Breath sounds: Normal breath sounds. No wheezing or rhonchi.  Musculoskeletal: Normal range of motion.  Skin:    Findings: No lesion or rash.  Neurological:     Mental Status: She is alert and oriented to person, place, and time.  Psychiatric:        Speech: Speech normal.        Behavior: Behavior normal.        Thought Content: Thought content normal.       Assessment & Plan    1. Subacute maxillary sinusitis Onset over the past month. No fever but has had some hacking cough with sinus pressure and popping in ears. Rare sputum production. Poor transillumination of maxillary sinuses and will treat with steroid nasal spray and Mucinex-DM. May use Z-pak if fever or  purulent mucus develops. Increase fluid intake and recheck prn. - azithromycin (ZITHROMAX) 250 MG tablet; Take 2 tablets by mouth the first day then one daily for 4 days.  Dispense: 6 tablet; Refill: 0     Dortha Kern, PA  [facility]

## 2021-10-12 ENCOUNTER — Telehealth: Payer: BC Managed Care – PPO | Admitting: Physician Assistant

## 2021-10-12 DIAGNOSIS — J208 Acute bronchitis due to other specified organisms: Secondary | ICD-10-CM

## 2021-10-12 DIAGNOSIS — B9689 Other specified bacterial agents as the cause of diseases classified elsewhere: Secondary | ICD-10-CM

## 2021-10-12 MED ORDER — ALBUTEROL SULFATE HFA 108 (90 BASE) MCG/ACT IN AERS: 2.0000 | INHALATION_SPRAY | Freq: Four times a day (QID) | RESPIRATORY_TRACT | 0 refills | Status: AC | PRN

## 2021-10-12 MED ORDER — AZITHROMYCIN 250 MG PO TABS: ORAL_TABLET | ORAL | 0 refills | Status: AC

## 2021-10-12 MED ORDER — PROMETHAZINE-DM 6.25-15 MG/5ML PO SYRP: 5.0000 mL | ORAL_SOLUTION | Freq: Four times a day (QID) | ORAL | 0 refills | Status: AC | PRN

## 2021-10-12 MED ORDER — PREDNISONE 20 MG PO TABS: 20.0000 mg | ORAL_TABLET | Freq: Every day | ORAL | 0 refills | Status: DC

## 2021-10-12 NOTE — Progress Notes (Signed)
Virtual Visit Consent   Barbara Perez, you are scheduled for a virtual visit with a [employer] provider today.     Just as with appointments in the office, your consent must be obtained to participate.  Your consent will be active for this visit and any virtual visit you may have with one of our providers in the next 365 days.     If you have a MyChart account, a copy of this consent can be sent to you electronically.  All virtual visits are billed to your insurance company just like a traditional visit in the office.    As this is a virtual visit, video technology does not allow for your provider to perform a traditional examination.  This may limit your provider's ability to fully assess your condition.  If your provider identifies any concerns that need to be evaluated in person or the need to arrange testing (such as labs, EKG, etc.), we will make arrangements to do so.     Although advances in technology are sophisticated, we cannot ensure that it will always work on either your end or our end.  If the connection with a video visit is poor, the visit may have to be switched to a telephone visit.  With either a video or telephone visit, we are not always able to ensure that we have a secure connection.     I need to obtain your verbal consent now.   Are you willing to proceed with your visit today?    Barbara Perez has provided verbal consent on 10/12/2021 for a virtual visit (video or telephone).   Barbara Loveless, PA-C   Date: 10/12/2021 2:12 PM   Virtual Visit via Video Note   I, Barbara Perez, connected with  Barbara Perez  ([id number], 1985/11/26) on 10/12/21 at  1:45 PM EST by a video-enabled telemedicine application and verified that I am speaking with the correct person using two identifiers.  Location: Patient: Virtual Visit Location Patient: Home Provider: Virtual Visit Location Provider: Home Office   I discussed the limitations of evaluation and management  by telemedicine and the availability of in person appointments. The patient expressed understanding and agreed to proceed.  Interactive audio and video communications were attempted, although failed due to patient's inability to connect to video. Continued visit with audio only interaction with patient agreement.    History of Present Illness: Barbara Perez is a 36 y.o. who identifies as a female who was assigned female at birth, and is being seen today for cough.  HPI: Cough This is a new problem. The current episode started 1 to 4 weeks ago. The problem has been gradually worsening. The problem occurs constantly. The cough is Non-productive. Associated symptoms include myalgias, a sore throat, shortness of breath (with coughing) and wheezing. Pertinent negatives include no chills, fever (higher than typical, but none documented), nasal congestion, postnasal drip or rhinorrhea. Associated symptoms comments: Laryngitis, night sweats. She has tried a beta-agonist inhaler, prescription cough suppressant and rest (tylenol and ibuprofen, tessalon, claritin, nifedipine, sertraline) for the symptoms. The treatment provided no relief. Her past medical history is significant for asthma (told by grandparents she did, but not truly known), bronchitis, environmental allergies and pneumonia (2019).  She is currently breastfeeding.   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  Irritable bowel syndrome with diarrhea 03/31/2015   Scoliosis 03/31/2015    Allergies:  Allergies  Allergen Reactions   Measles, Mumps & Rubella Vac Anaphylaxis   Ceclor [Cefaclor] Other (See Comments)   Penicillins Other (See Comments)   Sulfamethoxazole-Trimethoprim     Other reaction(s): Other (See Comments) Unknown, rxn as a child   Suprax [Cefixime]     Medications:  Current Outpatient Medications:    albuterol (VENTOLIN HFA) 108 (90 Base) MCG/ACT inhaler, Inhale 2 puffs into the lungs every 6 (six) hours as needed for wheezing or shortness of breath., Disp: 18 g, Rfl: 0   azithromycin (ZITHROMAX) 250 MG tablet, Take 2 tablets on day 1, then 1 tablet daily on days 2 through 5, Disp: 6 tablet, Rfl: 0   predniSONE (DELTASONE) 20 MG tablet, Take 1 tablet (20 mg total) by mouth daily with breakfast., Disp: 5 tablet, Rfl: 0   promethazine-dextromethorphan (PROMETHAZINE-DM) 6.25-15 MG/5ML syrup, Take 5 mLs by mouth 4 (four) times daily as needed., Disp: 118 mL, Rfl: 0   acetaminophen (TYLENOL) 500 MG tablet, Take 500 mg by mouth every 6 (six) hours as needed., Disp: , Rfl:    cetirizine (ZYRTEC) 10 MG tablet, Take 10 mg by mouth daily., Disp: , Rfl:    docusate sodium (COLACE) 100 MG capsule, Take 100 mg by mouth 2 (two) times daily., Disp: , Rfl:    ibuprofen (ADVIL,MOTRIN) 600 MG tablet, Take 1 tablet (600 mg total) by mouth every 8 (eight) hours as needed for fever or moderate pain., Disp: 30 tablet, Rfl: 0   levonorgestrel (MIRENA) 20 MCG/24HR IUD, 1 each by Intrauterine route once., Disp: , Rfl:    Melatonin 5 MG LOZG, Place under the tongue., Disp: , Rfl:    Multiple Vitamin (MULTIVITAMIN) tablet, Take 1 tablet by mouth daily., Disp: , Rfl:    polyethylene glycol (MIRALAX / GLYCOLAX) packet, Take 17 g by mouth daily., Disp: , Rfl:    Probiotic Product (PROBIOTIC ADVANCED PO), Take by mouth., Disp: , Rfl:    sertraline (ZOLOFT) 50 MG tablet, Take 1 tablet (50 mg total) by mouth daily. (Patient not taking: Reported on 08/11/2018), Disp: 30 tablet, Rfl: 3  Observations/Objective: Patient is well-developed, well-nourished in no acute distress.  Resting comfortably at home.  Head is normocephalic, atraumatic.  No labored breathing.  Speech is clear and coherent with logical content.  Patient is alert and oriented at baseline.    Assessment  and Plan: 1. Acute bacterial bronchitis - azithromycin (ZITHROMAX) 250 MG tablet; Take 2 tablets on day 1, then 1 tablet daily on days 2 through 5  Dispense: 6 tablet; Refill: 0 - predniSONE (DELTASONE) 20 MG tablet; Take 1 tablet (20 mg total) by mouth daily with breakfast.  Dispense: 5 tablet; Refill: 0 - albuterol (VENTOLIN HFA) 108 (90 Base) MCG/ACT inhaler; Inhale 2 puffs into the lungs every 6 (six) hours as needed for wheezing or shortness of breath.  Dispense: 18 g; Refill: 0 - promethazine-dextromethorphan (PROMETHAZINE-DM) 6.25-15 MG/5ML syrup; Take 5 mLs by mouth 4 (four) times daily as needed.  Dispense: 118 mL; Refill: 0  - Suspect bacterial bronchitis or atypical pneumonia - Will treat with Zpack, prednisone, promethazine DM and albuterol - Push fluids - Rest - Seek in person evaluation if not improving or if symptoms worsen  Follow Up Instructions: I discussed the assessment and treatment plan with the patient. The patient was provided an opportunity to ask questions and all were answered. The patient agreed with the plan  and demonstrated an understanding of the instructions.  A copy of instructions were sent to the patient via MyChart unless otherwise noted below.   The patient was advised to call back or seek an in-person evaluation if the symptoms worsen or if the condition fails to improve as anticipated.  Time:  I spent 21 minutes with the patient via telehealth technology discussing the above problems/concerns.    Barbara Loveless, PA-C

## 2021-10-12 NOTE — Patient Instructions (Signed)
Barbara Perez, thank you for joining [name], PA-C for today's virtual visit.  While this provider is not your primary care provider (PCP), if your PCP is located in our provider database this encounter information will be shared with them immediately following your visit.  Consent: (Patient) Barbara Perez provided verbal consent for this virtual visit at the beginning of the encounter.  Current Medications:  Current Outpatient Medications:    albuterol (VENTOLIN HFA) 108 (90 Base) MCG/ACT inhaler, Inhale 2 puffs into the lungs every 6 (six) hours as needed for wheezing or shortness of breath., Disp: 18 g, Rfl: 0   azithromycin (ZITHROMAX) 250 MG tablet, Take 2 tablets on day 1, then 1 tablet daily on days 2 through 5, Disp: 6 tablet, Rfl: 0   predniSONE (DELTASONE) 20 MG tablet, Take 1 tablet (20 mg total) by mouth daily with breakfast., Disp: 5 tablet, Rfl: 0   promethazine-dextromethorphan (PROMETHAZINE-DM) 6.25-15 MG/5ML syrup, Take 5 mLs by mouth 4 (four) times daily as needed., Disp: 118 mL, Rfl: 0   acetaminophen (TYLENOL) 500 MG tablet, Take 500 mg by mouth every 6 (six) hours as needed., Disp: , Rfl:    cetirizine (ZYRTEC) 10 MG tablet, Take 10 mg by mouth daily., Disp: , Rfl:    docusate sodium (COLACE) 100 MG capsule, Take 100 mg by mouth 2 (two) times daily., Disp: , Rfl:    ibuprofen (ADVIL,MOTRIN) 600 MG tablet, Take 1 tablet (600 mg total) by mouth every 8 (eight) hours as needed for fever or moderate pain., Disp: 30 tablet, Rfl: 0   levonorgestrel (MIRENA) 20 MCG/24HR IUD, 1 each by Intrauterine route once., Disp: , Rfl:    Melatonin 5 MG LOZG, Place under the tongue., Disp: , Rfl:    Multiple Vitamin (MULTIVITAMIN) tablet, Take 1 tablet by mouth daily., Disp: , Rfl:    polyethylene glycol (MIRALAX / GLYCOLAX) packet, Take 17 g by mouth daily., Disp: , Rfl:    Probiotic Product (PROBIOTIC ADVANCED PO), Take by mouth., Disp: , Rfl:    sertraline (ZOLOFT) 50 MG  tablet, Take 1 tablet (50 mg total) by mouth daily. (Patient not taking: Reported on 08/11/2018), Disp: 30 tablet, Rfl: 3   Medications ordered in this encounter:  Meds ordered this encounter  Medications   azithromycin (ZITHROMAX) 250 MG tablet    Sig: Take 2 tablets on day 1, then 1 tablet daily on days 2 through 5    Dispense:  6 tablet    Refill:  0    Order Specific Question:   Supervising Provider    Answer:   Hyacinth Meeker, BRIAN [[id number]]   predniSONE (DELTASONE) 20 MG tablet    Sig: Take 1 tablet (20 mg total) by mouth daily with breakfast.    Dispense:  5 tablet    Refill:  0    Order Specific Question:   Supervising Provider    Answer:   MILLER, BRIAN [[id number]]   albuterol (VENTOLIN HFA) 108 (90 Base) MCG/ACT inhaler    Sig: Inhale 2 puffs into the lungs every 6 (six) hours as needed for wheezing or shortness of breath.    Dispense:  18 g    Refill:  0    Order Specific Question:   Supervising Provider    Answer:   Hyacinth Meeker, BRIAN [[id number]]   promethazine-dextromethorphan (PROMETHAZINE-DM) 6.25-15 MG/5ML syrup    Sig: Take 5 mLs by mouth 4 (four) times daily as needed.    Dispense:  118 mL  Refill:  0    Order Specific Question:   Supervising Provider    Answer:   Eber Hong [[id number]]     *If you need refills on other medications prior to your next appointment, please contact your pharmacy*  Follow-Up: Call back or seek an in-person evaluation if the symptoms worsen or if the condition fails to improve as anticipated.  Other Instructions Acute Bronchitis, Adult Acute bronchitis is sudden inflammation of the main airways (bronchi) that come off the windpipe (trachea) in the lungs. The swelling causes the airways to get smaller and make more mucus than normal. This can make it hard to breathe and can cause coughing or noisy breathing (wheezing). Acute bronchitis may last several weeks. The cough may last longer. Allergies, asthma, and exposure to smoke may make the condition  worse. What are the causes? This condition can be caused by germs and by substances that irritate the lungs, including: Cold and flu viruses. The most common cause of this condition is the virus that causes the common cold. Bacteria. This is less common. Breathing in substances that irritate the lungs, including: Smoke from cigarettes and other forms of tobacco. Dust and pollen. Fumes from household cleaning products, gases, or burned fuel. Indoor or outdoor air pollution. What increases the risk? The following factors may make you more likely to develop this condition: A weak body's defense system, also called the immune system. A condition that affects your lungs and breathing, such as asthma. What are the signs or symptoms? Common symptoms of this condition include: Coughing. This may bring up clear, yellow, or green mucus from your lungs (sputum). Wheezing. Runny or stuffy nose. Having too much mucus in your lungs (chest congestion). Shortness of breath. Aches and pains, including sore throat or chest. How is this diagnosed? This condition is usually diagnosed based on: Your symptoms and medical history. A physical exam. You may also have other tests, including tests to rule out other conditions, such as pneumonia. These tests include: A test of lung function. Test of a mucus sample to look for the presence of bacteria. Tests to check the oxygen level in your blood. Blood tests. Chest X-ray. How is this treated? Most cases of acute bronchitis clear up over time without treatment. Your health care provider may recommend: Drinking more fluids to help thin your mucus so it is easier to cough up. Taking inhaled medicine (inhaler) to improve air flow in and out of your lungs. Using a vaporizer or a humidifier. These are machines that add water to the air to help you breathe better. Taking a medicine that thins mucus and clears congestion (expectorant). Taking a medicine that  prevents or stops coughing (cough suppressant). It is notcommon to take an antibiotic medicine for this condition. Follow these instructions at home:  Take over-the-counter and prescription medicines only as told by your health care provider. Use an inhaler, vaporizer, or humidifier as told by your health care provider. Take two teaspoons (10 mL) of honey at bedtime to lessen coughing at night. Drink enough fluid to keep your urine pale yellow. Do not use any products that contain nicotine or tobacco. These products include cigarettes, chewing tobacco, and vaping devices, such as e-cigarettes. If you need help quitting, ask your health care provider. Get plenty of rest. Return to your normal activities as told by your health care provider. Ask your health care provider what activities are safe for you. Keep all follow-up visits. This is important. How is this prevented?  To lower your risk of getting this condition again: Wash your hands often with soap and water for at least 20 seconds. If soap and water are not available, use hand sanitizer. Avoid contact with people who have cold symptoms. Try not to touch your mouth, nose, or eyes with your hands. Avoid breathing in smoke or chemical fumes. Breathing smoke or chemical fumes will make your condition worse. Get the flu shot every year. Contact a health care provider if: Your symptoms do not improve after 2 weeks. You have trouble coughing up the mucus. Your cough keeps you awake at night. You have a fever. Get help right away if you: Cough up blood. Feel pain in your chest. Have severe shortness of breath. Faint or keep feeling like you are going to faint. Have a severe headache. Have a fever or chills that get worse. These symptoms may represent a serious problem that is an emergency. Do not wait to see if the symptoms will go away. Get medical help right away. Call your local emergency services (911 in the [place] Do not drive  yourself to the hospital. Summary Acute bronchitis is inflammation of the main airways (bronchi) that come off the windpipe (trachea) in the lungs. The swelling causes the airways to get smaller and make more mucus than normal. Drinking more fluids can help thin your mucus so it is easier to cough up. Take over-the-counter and prescription medicines only as told by your health care provider. Do not use any products that contain nicotine or tobacco. These products include cigarettes, chewing tobacco, and vaping devices, such as e-cigarettes. If you need help quitting, ask your health care provider. Contact a health care provider if your symptoms do not improve after 2 weeks. This information is not intended to replace advice given to you by your health care provider. Make sure you discuss any questions you have with your health care provider. Document Revised: 12/13/2020 Document Reviewed: 12/13/2020 Elsevier Patient Education  2022 [employer].    If you have been instructed to have an in-person evaluation today at a local Urgent Care facility, please use the link below. It will take you to a list of all of our available [facility], including address, phone number and hours of operation. Please do not delay care.  [facility]  If you or a family member do not have a primary care provider, use the link below to schedule a visit and establish care. When you choose a [facility] primary care physician or advanced practice provider, you gain a long-term partner in health. Find a Primary Care Provider  Learn more about [facility]'s in-office and virtual care options: [facility] Get Care Now

## 2021-10-25 ENCOUNTER — Telehealth: Payer: Self-pay | Admitting: Physician Assistant

## 2021-10-25 DIAGNOSIS — J069 Acute upper respiratory infection, unspecified: Secondary | ICD-10-CM

## 2021-10-25 MED ORDER — PREDNISONE 20 MG PO TABS
40.0000 mg | ORAL_TABLET | Freq: Every day | ORAL | 0 refills | Status: AC
Start: 2021-10-25 — End: ?

## 2021-10-25 MED ORDER — DOXYCYCLINE HYCLATE 100 MG PO TABS: 100.0000 mg | ORAL_TABLET | Freq: Two times a day (BID) | ORAL | 0 refills | Status: AC

## 2021-10-25 MED ORDER — BENZONATATE 100 MG PO CAPS: 100.0000 mg | ORAL_CAPSULE | Freq: Three times a day (TID) | ORAL | 0 refills | Status: AC | PRN

## 2021-10-25 NOTE — Patient Instructions (Addendum)
?Marzetta Board, thank you for joining [name], PA-C for today's virtual visit.  While this provider is not your primary care provider (PCP), if your PCP is located in our provider database this encounter information will be shared with them immediately following your visit. ? ?Consent: ?(Patient) Barbara Perez provided verbal consent for this virtual visit at the beginning of the encounter. ? ?Current Medications: ? ?Current Outpatient Medications:  ?  acetaminophen (TYLENOL) 500 MG tablet, Take 500 mg by mouth every 6 (six) hours as needed., Disp: , Rfl:  ?  albuterol (VENTOLIN HFA) 108 (90 Base) MCG/ACT inhaler, Inhale 2 puffs into the lungs every 6 (six) hours as needed for wheezing or shortness of breath., Disp: 18 g, Rfl: 0 ?  cetirizine (ZYRTEC) 10 MG tablet, Take 10 mg by mouth daily., Disp: , Rfl:  ?  docusate sodium (COLACE) 100 MG capsule, Take 100 mg by mouth 2 (two) times daily., Disp: , Rfl:  ?  ibuprofen (ADVIL,MOTRIN) 600 MG tablet, Take 1 tablet (600 mg total) by mouth every 8 (eight) hours as needed for fever or moderate pain., Disp: 30 tablet, Rfl: 0 ?  levonorgestrel (MIRENA) 20 MCG/24HR IUD, 1 each by Intrauterine route once., Disp: , Rfl:  ?  Melatonin 5 MG LOZG, Place under the tongue., Disp: , Rfl:  ?  Multiple Vitamin (MULTIVITAMIN) tablet, Take 1 tablet by mouth daily., Disp: , Rfl:  ?  polyethylene glycol (MIRALAX / GLYCOLAX) packet, Take 17 g by mouth daily., Disp: , Rfl:  ?  predniSONE (DELTASONE) 20 MG tablet, Take 1 tablet (20 mg total) by mouth daily with breakfast., Disp: 5 tablet, Rfl: 0 ?  Probiotic Product (PROBIOTIC ADVANCED PO), Take by mouth., Disp: , Rfl:  ?  promethazine-dextromethorphan (PROMETHAZINE-DM) 6.25-15 MG/5ML syrup, Take 5 mLs by mouth 4 (four) times daily as needed., Disp: 118 mL, Rfl: 0 ?  sertraline (ZOLOFT) 50 MG tablet, Take 1 tablet (50 mg total) by mouth daily. (Patient not taking: Reported on 08/11/2018), Disp: 30 tablet, Rfl: 3   ? ?Medications ordered in this encounter:  ?No orders of the defined types were placed in this encounter. ?  ? ?*If you need refills on other medications prior to your next appointment, please contact your pharmacy* ? ?Follow-Up: ?Call back or seek an in-person evaluation if the symptoms worsen or if the condition fails to improve as anticipated. ? ?Other Instructions ?Take antibiotic (Doxycycline) as directed.  Increase fluids.  Get plenty of rest. Use Mucinex for congestion. Use the Tessalon along with the Promethazine-DM.  Take a daily probiotic (I recommend Align or Culturelle, but even Activia Yogurt may be beneficial).  A humidifier placed in the bedroom may offer some relief for a dry, scratchy throat of nasal irritation.  Read information below on acute bronchitis.  ? ?If anything worsens or you note increased rate of breathing at rest or O2 levels dropping from mid 90s-- you need to be evaluated in the ER ASAP. DO not delay care! ?Acute Bronchitis ?Bronchitis is when the airways that extend from the windpipe into the lungs get red, puffy, and painful (inflamed). Bronchitis often causes thick spit (mucus) to develop. This leads to a cough. A cough is the most common symptom of bronchitis. ?In acute bronchitis, the condition usually begins suddenly and goes away over time (usually in 2 weeks). Smoking, allergies, and asthma can make bronchitis worse. Repeated episodes of bronchitis may cause more lung problems. ? ?HOME CARE ?Rest. ?Drink enough fluids to  keep your pee (urine) clear or pale yellow (unless you need to limit fluids as told by your doctor). ?Only take over-the-counter or prescription medicines as told by your doctor. ?Avoid smoking and secondhand smoke. These can make bronchitis worse. If you are a smoker, think about using nicotine gum or skin patches. Quitting smoking will help your lungs heal faster. ?Reduce the chance of getting bronchitis again by: ?Washing your hands often. ?Avoiding  people with cold symptoms. ?Trying not to touch your hands to your mouth, nose, or eyes. ?Follow up with your doctor as told. ? ?GET HELP IF: ?Your symptoms do not improve after 1 week of treatment. Symptoms include: ?Cough. ?Fever. ?Coughing up thick spit. ?Body aches. ?Chest congestion. ?Chills. ?Shortness of breath. ?Sore throat. ? ?GET HELP RIGHT AWAY IF:  ?You have an increased fever. ?You have chills. ?You have severe shortness of breath. ?You have bloody thick spit (sputum). ?You throw up (vomit) often. ?You lose too much body fluid (dehydration). ?You have a severe headache. ?You faint. ? ?MAKE SURE YOU:  ?Understand these instructions. ?Will watch your condition. ?Will get help right away if you are not doing well or get worse. ?Document Released: 01/29/2008 Document Revised: 04/14/2013 Document Reviewed: 02/02/2013 ?ExitCare? Patient Information ?2015 [employer]. This information is not intended to replace advice given to you by your health care provider. Make sure you discuss any questions you have with your health care provider. ? ? ? ?If you have been instructed to have an in-person evaluation today at a local Urgent Care facility, please use the link below. It will take you to a list of all of our available [facility], including address, phone number and hours of operation. Please do not delay care.  ?[facility] ? ?If you or a family member do not have a primary care provider, use the link below to schedule a visit and establish care. When you choose a [facility] primary care physician or advanced practice provider, you gain a long-term partner in health. ?Find a Primary Care Provider ? ?Learn more about [facility]'s in-office and virtual care options: ?[facility] - Get Care Now  ?

## 2021-10-25 NOTE — Progress Notes (Signed)
?Virtual Visit Consent  ? ?Marzetta Board, you are scheduled for a virtual visit with a [facility] provider today.   ?  ?Just as with appointments in the office, your consent must be obtained to participate.  Your consent will be active for this visit and any virtual visit you may have with one of our providers in the next 365 days.   ?  ?If you have a MyChart account, a copy of this consent can be sent to you electronically.  All virtual visits are billed to your insurance company just like a traditional visit in the office.   ? ?As this is a virtual visit, video technology does not allow for your provider to perform a traditional examination.  This may limit your provider's ability to fully assess your condition.  If your provider identifies any concerns that need to be evaluated in person or the need to arrange testing (such as labs, EKG, etc.), we will make arrangements to do so.   ?  ?Although advances in technology are sophisticated, we cannot ensure that it will always work on either your end or our end.  If the connection with a video visit is poor, the visit may have to be switched to a telephone visit.  With either a video or telephone visit, we are not always able to ensure that we have a secure connection.    ? ?I need to obtain your verbal consent now.   Are you willing to proceed with your visit today?  ?  ?Barbara Perez has provided verbal consent on 10/25/2021 for a virtual visit (video or telephone). ?  ?Barbara Climes, PA-C  ? ?Date: 10/25/2021 7:24 PM ? ? ?Virtual Visit via Video Note  ? ?IPiedad Perez, connected with  Barbara Perez  ([id number], June 15, 1948) on 10/25/21 at  7:15 PM EST by a video-enabled telemedicine application and verified that I am speaking with the correct person using two identifiers. ? ?Location: ?Patient: Virtual Visit Location Patient: Home ?Provider: Virtual Visit Location Provider: Home Office ?  ?I discussed the limitations of evaluation and management by  telemedicine and the availability of in person appointments. The patient expressed understanding and agreed to proceed.   ? ?History of Present Illness: ?Barbara Perez is a 36 y.o. who identifies as a female who was assigned female at birth, and is being seen today for URI symptoms. Was seen for acute bacterial bronchitis about 3 weeks ago which had been present for about 2 weeks prior to evaluation. Was treated with z-pack and albuterol inhaler. Notes quick resolution of symptoms. Was doing fine up until this past week. Notes that mutliple family members in the household dealing with Norovirus in the pat couple of weeks, initially brought home from one of her kids. Notes she had noted a few days of nasal and head congestion with mild cough. In past 48 hours, now with significant issue with chest congestion, cough and some shortness of breath with exertion.Fever starting last night -- intermittent with Tmax of 101. Notes chills and sweats alternating. 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 HR 110 ma with exertion. Has had multiple COVID tests since this started - negative.  ? ? ? ?HPI: HPI  ?Problems:  ?Patient Active Problem List  ? Diagnosis Date Noted  ? Raynaud's disease   ? Urinary incontinence   ? Mixed incontinence 12/22/2015  ? 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 Irritable bowel syndrome with diarrhea 03/31/2015  ? Scoliosis 03/31/2015  ?  ?Allergies:  ?Allergies  ?Allergen Reactions  ? Measles, Mumps & Rubella Vac Anaphylaxis  ? Ceclor [Cefaclor] Other (See Comments)  ? Penicillins Other (See Comments)  ? Sulfamethoxazole-Trimethoprim   ?  Other reaction(s): Other (See Comments) ?Unknown, rxn as a child  ? Suprax [Cefixime]   ? ?Medications:  ?Current Outpatient Medications:  ?  benzonatate (TESSALON) 100 MG capsule, Take 1 capsule (100 mg total) by mouth 3 (three) times daily as needed for cough., Disp: 30  capsule, Rfl: 0 ?  doxycycline (VIBRA-TABS) 100 MG tablet, Take 1 tablet (100 mg total) by mouth 2 (two) times daily., Disp: 14 tablet, Rfl: 0 ?  predniSONE (DELTASONE) 20 MG tablet, Take 2 tablets (40 mg total) by mouth daily with breakfast., Disp: 10 tablet, Rfl: 0 ?  acetaminophen (TYLENOL) 500 MG tablet, Take 500 mg by mouth every 6 (six) hours as needed., Disp: , Rfl:  ?  albuterol (VENTOLIN HFA) 108 (90 Base) MCG/ACT inhaler, Inhale 2 puffs into the lungs every 6 (six) hours as needed for wheezing or shortness of breath., Disp: 18 g, Rfl: 0 ?  cetirizine (ZYRTEC) 10 MG tablet, Take 10 mg by mouth daily., Disp: , Rfl:  ?  docusate sodium (COLACE) 100 MG capsule, Take 100 mg by mouth 2 (two) times daily., Disp: , Rfl:  ?  ibuprofen (ADVIL,MOTRIN) 600 MG tablet, Take 1 tablet (600 mg total) by mouth every 8 (eight) hours as needed for fever or moderate pain., Disp: 30 tablet, Rfl: 0 ?  levonorgestrel (MIRENA) 20 MCG/24HR IUD, 1 each by Intrauterine route once., Disp: , Rfl:  ?  Melatonin 5 MG LOZG, Place under the tongue., Disp: , Rfl:  ?  Multiple Vitamin (MULTIVITAMIN) tablet, Take 1 tablet by mouth daily., Disp: , Rfl:  ?  polyethylene glycol (MIRALAX / GLYCOLAX) packet, Take 17 g by mouth daily., Disp: , Rfl:  ?  Probiotic Product (PROBIOTIC ADVANCED PO), Take by mouth., Disp: , Rfl:  ?  promethazine-dextromethorphan (PROMETHAZINE-DM) 6.25-15 MG/5ML syrup, Take 5 mLs by mouth 4 (four) times daily as needed., Disp: 118 mL, Rfl: 0 ?  sertraline (ZOLOFT) 50 MG tablet, Take 1 tablet (50 mg total) by mouth daily. (Patient not taking: Reported on 08/11/2018), Disp: 30 tablet, Rfl: 3 ? ?Observations/Objective: ?Patient is well-developed, well-nourished in no acute distress.  ?Resting comfortably at home.  ?Head is normocephalic, atraumatic.  ?No labored breathing. ?Speech is clear and coherent with logical content.  ?Patient is alert and oriented at baseline.  ? ?Assessment and Plan: ?1. Upper respiratory tract  infection, unspecified type ?- benzonatate (TESSALON) 100 MG capsule; Take 1 capsule (100 mg total) by mouth 3 (three) times daily as needed for cough.  Dispense: 30 capsule; Refill: 0 ?- predniSONE (DELTASONE) 20 MG tablet; Take 2 tablets (40 mg total) by mouth daily with breakfast.  Dispense: 10 tablet; Refill: 0 ?- doxycycline (VIBRA-TABS) 100 MG tablet; Take 1 tablet (100 mg total) by mouth 2 (two) times daily.  Dispense: 14 tablet; Refill: 0 ? ?Concern mainly for viral bronchitis and pneumonia but giving her history with prior CAP will add on antibiotic to cover our bases. Will start Doxycycline. Supportive measures and OTC medications reviewed. Will add Tessalon to her Promethazine-DM. Will give 5-day burst of prednisone for significant bronchospasm. She is to monitor O2 closely. If dropping or any worsening symptoms at all she must seek care at nearest UC/ER for  detailed examination and imaging. 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 ? ?Follow Up Instructions: ?I discussed the assessment and treatment plan with the patient. The patient was provided an opportunity to ask questions and all were answered. The patient agreed with the plan and demonstrated an understanding of the instructions.  A copy of instructions were sent to the patient via MyChart unless otherwise noted below.  ? ?The patient was advised to call back or seek an in-person evaluation if the symptoms worsen or if the condition fails to improve as anticipated. ? ?Time:  ?I spent 15 minutes with the patient via telehealth technology discussing the above problems/concerns.   ? ?Leeanne Rio, PA-C ?

## 2021-11-03 ENCOUNTER — Other Ambulatory Visit: Payer: Self-pay | Admitting: Physician Assistant

## 2021-11-03 DIAGNOSIS — J208 Acute bronchitis due to other specified organisms: Secondary | ICD-10-CM

## 2021-11-03 DIAGNOSIS — B9689 Other specified bacterial agents as the cause of diseases classified elsewhere: Secondary | ICD-10-CM

## 2023-08-06 ENCOUNTER — Ambulatory Visit: Payer: BC Managed Care – PPO | Admitting: Dermatology

## 2023-08-25 ENCOUNTER — Encounter: Payer: Self-pay | Admitting: Dermatology

## 2023-08-25 ENCOUNTER — Ambulatory Visit: Payer: BC Managed Care – PPO | Admitting: Dermatology

## 2023-08-25 DIAGNOSIS — Z86018 Personal history of other benign neoplasm: Secondary | ICD-10-CM

## 2023-08-25 DIAGNOSIS — Z1283 Encounter for screening for malignant neoplasm of skin: Secondary | ICD-10-CM

## 2023-08-25 DIAGNOSIS — D225 Melanocytic nevi of trunk: Secondary | ICD-10-CM

## 2023-08-25 DIAGNOSIS — L720 Epidermal cyst: Secondary | ICD-10-CM

## 2023-08-25 DIAGNOSIS — L814 Other melanin hyperpigmentation: Secondary | ICD-10-CM

## 2023-08-25 DIAGNOSIS — D229 Melanocytic nevi, unspecified: Secondary | ICD-10-CM

## 2023-08-25 DIAGNOSIS — L578 Other skin changes due to chronic exposure to nonionizing radiation: Secondary | ICD-10-CM | POA: Diagnosis not present

## 2023-08-25 DIAGNOSIS — L821 Other seborrheic keratosis: Secondary | ICD-10-CM | POA: Diagnosis not present

## 2023-08-25 DIAGNOSIS — Z808 Family history of malignant neoplasm of other organs or systems: Secondary | ICD-10-CM

## 2023-08-25 DIAGNOSIS — W908XXA Exposure to other nonionizing radiation, initial encounter: Secondary | ICD-10-CM

## 2023-08-25 NOTE — Progress Notes (Signed)
New Patient Visit   Subjective  Barbara Perez is a 37 y.o. female who presents for the following: Skin Cancer Screening and Full Body Skin Exam, hx of Dysplastic Nevi, Fhx of BCC, SCC Maternal grandfather, check spot L chest, noticed over past few yrs  The patient presents for Total-Body Skin Exam (TBSE) for skin cancer screening and mole check. The patient has spots, moles and lesions to be evaluated, some may be new or changing and the patient may have concern these could be cancer.    The following portions of the chart were reviewed this encounter and updated as appropriate: medications, allergies, medical history  Review of Systems:  No other skin or systemic complaints except as noted in HPI or Assessment and Plan.  Objective  Well appearing patient in no apparent distress; mood and affect are within normal limits.  A full examination was performed including scalp, head, eyes, ears, nose, lips, neck, chest, axillae, abdomen, back, buttocks, bilateral upper extremities, bilateral lower extremities, hands, feet, fingers, toes, fingernails, and toenails. All findings within normal limits unless otherwise noted below.   Exam of nails limited by presence of nail polish.   Relevant physical exam findings are noted in the Assessment and Plan.    Assessment & Plan   SKIN CANCER SCREENING PERFORMED TODAY.  ACTINIC DAMAGE - Chronic condition, secondary to cumulative UV/sun exposure - diffuse scaly erythematous macules with underlying dyspigmentation - Recommend daily broad spectrum sunscreen SPF 30+ to sun-exposed areas, reapply every 2 hours as needed.  - Staying in the shade or wearing long sleeves, sun glasses (UVA+UVB protection) and wide brim hats (4-inch brim around the entire circumference of the hat) are also recommended for sun protection.  - Call for new or changing lesions.  LENTIGINES, SEBORRHEIC KERATOSES, HEMANGIOMAS - Benign normal skin lesions -  Benign-appearing - Call for any changes  MELANOCYTIC NEVI - Tan-brown and/or pink-flesh-colored symmetric macules and papules - Benign appearing on exam today - Observation - Call clinic for new or changing moles - Recommend daily use of broad spectrum spf 30+ sunscreen to sun-exposed areas.  - L upper back 5.51mm pigmented lesion - Mid central abdomen 5.41mm irregular pigmented macule  FAMILY HISTORY OF SKIN CANCER What type(s):BCC, SCC Who affected:Maternal grandfather   HISTORY OF DYSPLASTIC NEVUS No evidence of recurrence today Recommend regular full body skin exams Recommend daily broad spectrum sunscreen SPF 30+ to sun-exposed areas, reapply every 2 hours as needed.  Call if any new or changing lesions are noted between office visits  - R mid to low back lat at the side, L medial buttock, L periumbilical UQA, R sup costal area, R inferior costal area, L post shoulder medial to sup scapula, L mid to upper back paraspinal  EPIDERMAL INCLUSION CYST L chest Exam: Subcutaneous nodule at L chest  Benign-appearing. Exam most consistent with an epidermal inclusion cyst. Discussed that a cyst is a benign growth that can grow over time and sometimes get irritated or inflamed. Recommend observation if it is not bothersome. Discussed option of surgical excision to remove it if it is growing, symptomatic, or other changes noted. Please call for new or changing lesions so they can be evaluated.      Return in about 1 year (around 08/24/2024) for TBSE, Hx of Dysplastic nevi.  I, Ardis Rowan, RMA, am acting as scribe for Elie Goody, MD .   Documentation: I have reviewed the above documentation for accuracy and completeness, and I agree with the  above.  Elie Goody, MD

## 2023-08-25 NOTE — Patient Instructions (Signed)

## 2024-08-09 ENCOUNTER — Ambulatory Visit: Payer: BC Managed Care – PPO | Admitting: Dermatology

## 2024-08-09 ENCOUNTER — Encounter: Payer: Self-pay | Admitting: Dermatology

## 2024-08-09 DIAGNOSIS — L821 Other seborrheic keratosis: Secondary | ICD-10-CM | POA: Diagnosis not present

## 2024-08-09 DIAGNOSIS — D225 Melanocytic nevi of trunk: Secondary | ICD-10-CM

## 2024-08-09 DIAGNOSIS — L814 Other melanin hyperpigmentation: Secondary | ICD-10-CM | POA: Diagnosis not present

## 2024-08-09 DIAGNOSIS — Z1283 Encounter for screening for malignant neoplasm of skin: Secondary | ICD-10-CM | POA: Diagnosis not present

## 2024-08-09 DIAGNOSIS — Z808 Family history of malignant neoplasm of other organs or systems: Secondary | ICD-10-CM | POA: Diagnosis not present

## 2024-08-09 DIAGNOSIS — L578 Other skin changes due to chronic exposure to nonionizing radiation: Secondary | ICD-10-CM

## 2024-08-09 DIAGNOSIS — W908XXA Exposure to other nonionizing radiation, initial encounter: Secondary | ICD-10-CM | POA: Diagnosis not present

## 2024-08-09 DIAGNOSIS — D1801 Hemangioma of skin and subcutaneous tissue: Secondary | ICD-10-CM

## 2024-08-09 DIAGNOSIS — Z86018 Personal history of other benign neoplasm: Secondary | ICD-10-CM | POA: Diagnosis not present

## 2024-08-09 DIAGNOSIS — D229 Melanocytic nevi, unspecified: Secondary | ICD-10-CM

## 2024-08-09 NOTE — Progress Notes (Signed)
 Follow-Up Visit   Subjective  Barbara Perez is a 38 y.o. female who presents for the following: Skin Cancer Screening and Full Body Skin Exam. Hx of dysplastic nevi. Fm Hx of skin cancer.   Check spot of concern under right breast.   The patient presents for Total-Body Skin Exam (TBSE) for skin cancer screening and mole check. The patient has spots, moles and lesions to be evaluated, some may be new or changing and the patient may have concern these could be cancer.    The following portions of the chart were reviewed this encounter and updated as appropriate: medications, allergies, medical history  Review of Systems:  No other skin or systemic complaints except as noted in HPI or Assessment and Plan.  Objective  Well appearing patient in no apparent distress; mood and affect are within normal limits.  A full examination was performed including scalp, head, eyes, ears, nose, lips, neck, chest, axillae, abdomen, back, buttocks, bilateral upper extremities, bilateral lower extremities, hands, feet, fingers, toes, fingernails, and toenails. All findings within normal limits unless otherwise noted below.   Relevant physical exam findings are noted in the Assessment and Plan.  Exam of nails limited by presence of nail polish.        Assessment & Plan   SKIN CANCER SCREENING PERFORMED TODAY.  HISTORY OF DYSPLASTIC NEVUS No evidence of recurrence today Recommend regular full body skin exams Recommend daily broad spectrum sunscreen SPF 30+ to sun-exposed areas, reapply every 2 hours as needed.  Call if any new or changing lesions are noted between office visits  - R mid to low back lat at the side, L medial buttock, L periumbilical UQA, R sup costal area, R inferior costal area, L post shoulder medial to sup scapula, L mid to upper back paraspinal  FAMILY HISTORY OF SKIN CANCER What type(s):BCC, SCC Who affected:Maternal grandfather   ACTINIC DAMAGE - Chronic condition,  secondary to cumulative UV/sun exposure - diffuse scaly erythematous macules with underlying dyspigmentation - Recommend daily broad spectrum sunscreen SPF 30+ to sun-exposed areas, reapply every 2 hours as needed.  - Staying in the shade or wearing long sleeves, sun glasses (UVA+UVB protection) and wide brim hats (4-inch brim around the entire circumference of the hat) are also recommended for sun protection.  - Call for new or changing lesions.  LENTIGINES, SEBORRHEIC KERATOSES, HEMANGIOMAS - Benign normal skin lesions - Benign-appearing - Call for any changes  MELANOCYTIC NEVI - Tan-brown and/or pink-flesh-colored symmetric macules and papules - Benign appearing on exam today - Observation - Call clinic for new or changing moles - Recommend daily use of broad spectrum spf 30+ sunscreen to sun-exposed areas.  - Check nails when remove polish.   SEBORRHEIC KERATOSIS - Stuck-on, waxy, tan-brown papule at right inframammary - Benign-appearing - Discussed benign etiology and prognosis. - Observe - Call for any changes  MELANOCYTIC NEVI Exam: 5 mm regular brown macule at LUQ abdomen.   Photo taken today.  Treatment Plan: Benign appearing on exam today. Recommend observation. Call clinic for new or changing moles. Recommend daily use of broad spectrum spf 30+ sunscreen to sun-exposed areas.   MULTIPLE BENIGN NEVI   LENTIGINES   ACTINIC ELASTOSIS   SEBORRHEIC KERATOSES   CHERRY ANGIOMA   Return in about 1 year (around 08/09/2025) for TBSE, HxDN.  I, Jill Parcell, CMA, am acting as scribe for Boneta Sharps, MD.   Documentation: I have reviewed the above documentation for accuracy and completeness, and I agree  with the above.  Boneta Sharps, MD

## 2024-08-09 NOTE — Patient Instructions (Signed)

## 2025-08-09 ENCOUNTER — Ambulatory Visit: Admitting: Dermatology
